# Patient Record
Sex: Female | Born: 1970 | Race: Black or African American | Hispanic: No | Marital: Married | Smoking: Never smoker
Health system: Southern US, Community
[De-identification: ages and names within clinical notes are randomized; demographics above are authoritative.]

## PROBLEM LIST (undated history)

## (undated) HISTORY — PX: ABDOMINAL HYSTERECTOMY: SHX81

---

## 1998-04-23 ENCOUNTER — Emergency Department (HOSPITAL_COMMUNITY): Admission: EM | Admit: 1998-04-23 | Discharge: 1998-04-23 | Payer: Self-pay | Admitting: Emergency Medicine

## 1998-09-27 ENCOUNTER — Inpatient Hospital Stay (HOSPITAL_COMMUNITY): Admission: AD | Admit: 1998-09-27 | Discharge: 1998-09-27 | Payer: Self-pay | Admitting: *Deleted

## 1998-10-06 ENCOUNTER — Ambulatory Visit (HOSPITAL_COMMUNITY): Admission: RE | Admit: 1998-10-06 | Discharge: 1998-10-06 | Payer: Self-pay | Admitting: Obstetrics and Gynecology

## 1998-11-12 ENCOUNTER — Inpatient Hospital Stay (HOSPITAL_COMMUNITY)
Admission: AD | Admit: 1998-11-12 | Discharge: 1998-11-16 | Payer: Self-pay | Admitting: Physical Medicine & Rehabilitation

## 1998-11-16 ENCOUNTER — Encounter (HOSPITAL_COMMUNITY): Admission: RE | Admit: 1998-11-16 | Discharge: 1999-02-14 | Payer: Self-pay | Admitting: *Deleted

## 1998-12-16 ENCOUNTER — Other Ambulatory Visit: Admission: RE | Admit: 1998-12-16 | Discharge: 1998-12-16 | Payer: Self-pay | Admitting: *Deleted

## 1999-11-01 ENCOUNTER — Inpatient Hospital Stay (HOSPITAL_COMMUNITY): Admission: AD | Admit: 1999-11-01 | Discharge: 1999-11-01 | Payer: Self-pay | Admitting: *Deleted

## 2000-01-01 ENCOUNTER — Inpatient Hospital Stay (HOSPITAL_COMMUNITY): Admission: RE | Admit: 2000-01-01 | Discharge: 2000-01-04 | Payer: Self-pay | Admitting: Obstetrics & Gynecology

## 2001-05-27 ENCOUNTER — Emergency Department (HOSPITAL_COMMUNITY): Admission: EM | Admit: 2001-05-27 | Discharge: 2001-05-28 | Payer: Self-pay | Admitting: *Deleted

## 2001-05-28 ENCOUNTER — Encounter: Payer: Self-pay | Admitting: Emergency Medicine

## 2001-06-10 ENCOUNTER — Other Ambulatory Visit: Admission: RE | Admit: 2001-06-10 | Discharge: 2001-06-10 | Payer: Self-pay | Admitting: *Deleted

## 2002-07-22 ENCOUNTER — Other Ambulatory Visit: Admission: RE | Admit: 2002-07-22 | Discharge: 2002-07-22 | Payer: Self-pay | Admitting: *Deleted

## 2004-04-12 ENCOUNTER — Other Ambulatory Visit: Admission: RE | Admit: 2004-04-12 | Discharge: 2004-04-12 | Payer: Self-pay | Admitting: Obstetrics and Gynecology

## 2004-06-21 ENCOUNTER — Ambulatory Visit (HOSPITAL_COMMUNITY): Admission: RE | Admit: 2004-06-21 | Discharge: 2004-06-21 | Payer: Self-pay | Admitting: Obstetrics and Gynecology

## 2004-06-21 ENCOUNTER — Ambulatory Visit (HOSPITAL_BASED_OUTPATIENT_CLINIC_OR_DEPARTMENT_OTHER): Admission: RE | Admit: 2004-06-21 | Discharge: 2004-06-21 | Payer: Self-pay | Admitting: Obstetrics and Gynecology

## 2010-08-15 ENCOUNTER — Ambulatory Visit: Payer: Self-pay | Admitting: Obstetrics and Gynecology

## 2012-06-19 ENCOUNTER — Emergency Department: Payer: Self-pay | Admitting: Internal Medicine

## 2012-06-19 LAB — CBC
HCT: 39.1 % (ref 35.0–47.0)
HGB: 13.4 g/dL (ref 12.0–16.0)
MCH: 31.3 pg (ref 26.0–34.0)
MCHC: 34.2 g/dL (ref 32.0–36.0)
MCV: 91 fL (ref 80–100)
Platelet: 283 10*3/uL (ref 150–440)
RBC: 4.28 10*6/uL (ref 3.80–5.20)
RDW: 13.4 % (ref 11.5–14.5)
WBC: 5.1 10*3/uL (ref 3.6–11.0)

## 2012-06-19 LAB — COMPREHENSIVE METABOLIC PANEL
Albumin: 4 g/dL (ref 3.4–5.0)
Alkaline Phosphatase: 63 U/L (ref 50–136)
Anion Gap: 8 (ref 7–16)
BUN: 10 mg/dL (ref 7–18)
Bilirubin,Total: 0.4 mg/dL (ref 0.2–1.0)
Calcium, Total: 9.2 mg/dL (ref 8.5–10.1)
Chloride: 103 mmol/L (ref 98–107)
Co2: 28 mmol/L (ref 21–32)
Creatinine: 0.9 mg/dL (ref 0.60–1.30)
EGFR (African American): >60
EGFR (Non-African Amer.): >60
Glucose: 102 mg/dL — ABNORMAL HIGH (ref 65–99)
Osmolality: 277 (ref 275–301)
Potassium: 3.8 mmol/L (ref 3.5–5.1)
SGOT(AST): 15 U/L (ref 15–37)
SGPT (ALT): 35 U/L (ref 12–78)
Sodium: 139 mmol/L (ref 136–145)
Total Protein: 7.9 g/dL (ref 6.4–8.2)

## 2012-06-19 LAB — URINALYSIS, COMPLETE
Bilirubin,UR: NEGATIVE
Blood: NEGATIVE
Glucose,UR: NEGATIVE mg/dL (ref 0–75)
Ketone: NEGATIVE
Nitrite: NEGATIVE
Ph: 8 (ref 4.5–8.0)
Protein: NEGATIVE
RBC,UR: <1 /HPF (ref 0–5)
Renal Epithelial: <1
Specific Gravity: 1.003 (ref 1.003–1.030)
Squamous Epithelial: 3
WBC UR: NONE SEEN /HPF (ref 0–5)

## 2012-06-19 LAB — LIPASE, BLOOD: Lipase: 115 U/L (ref 73–393)

## 2013-09-11 ENCOUNTER — Emergency Department: Payer: Self-pay | Admitting: Emergency Medicine

## 2013-09-11 LAB — BASIC METABOLIC PANEL
Anion Gap: 5 — ABNORMAL LOW (ref 7–16)
BUN: 9 mg/dL (ref 7–18)
Calcium, Total: 8.6 mg/dL (ref 8.5–10.1)
Chloride: 106 mmol/L (ref 98–107)
Co2: 27 mmol/L (ref 21–32)
Creatinine: 0.96 mg/dL (ref 0.60–1.30)
EGFR (African American): >60
EGFR (Non-African Amer.): >60
Glucose: 88 mg/dL (ref 65–99)
Osmolality: 274 (ref 275–301)
Potassium: 3.8 mmol/L (ref 3.5–5.1)
Sodium: 138 mmol/L (ref 136–145)

## 2013-09-11 LAB — TROPONIN I
Troponin-I: <0.02 ng/mL
Troponin-I: <0.02 ng/mL

## 2013-09-11 LAB — CBC
HCT: 35.6 % (ref 35.0–47.0)
HGB: 12 g/dL (ref 12.0–16.0)
MCH: 31.4 pg (ref 26.0–34.0)
MCHC: 33.8 g/dL (ref 32.0–36.0)
MCV: 93 fL (ref 80–100)
Platelet: 169 10*3/uL (ref 150–440)
RBC: 3.83 10*6/uL (ref 3.80–5.20)
RDW: 13.6 % (ref 11.5–14.5)
WBC: 6 10*3/uL (ref 3.6–11.0)

## 2013-10-25 IMAGING — CR DG ABDOMEN 3V
1 series · 4 of 4 positions shown · non-contrast
Comparison: none

REASON FOR EXAM: abd pain
COMMENTS:

[Series 1: pa · 0.17mm/px · 4 of 4 slices shown]
[im 1/4]
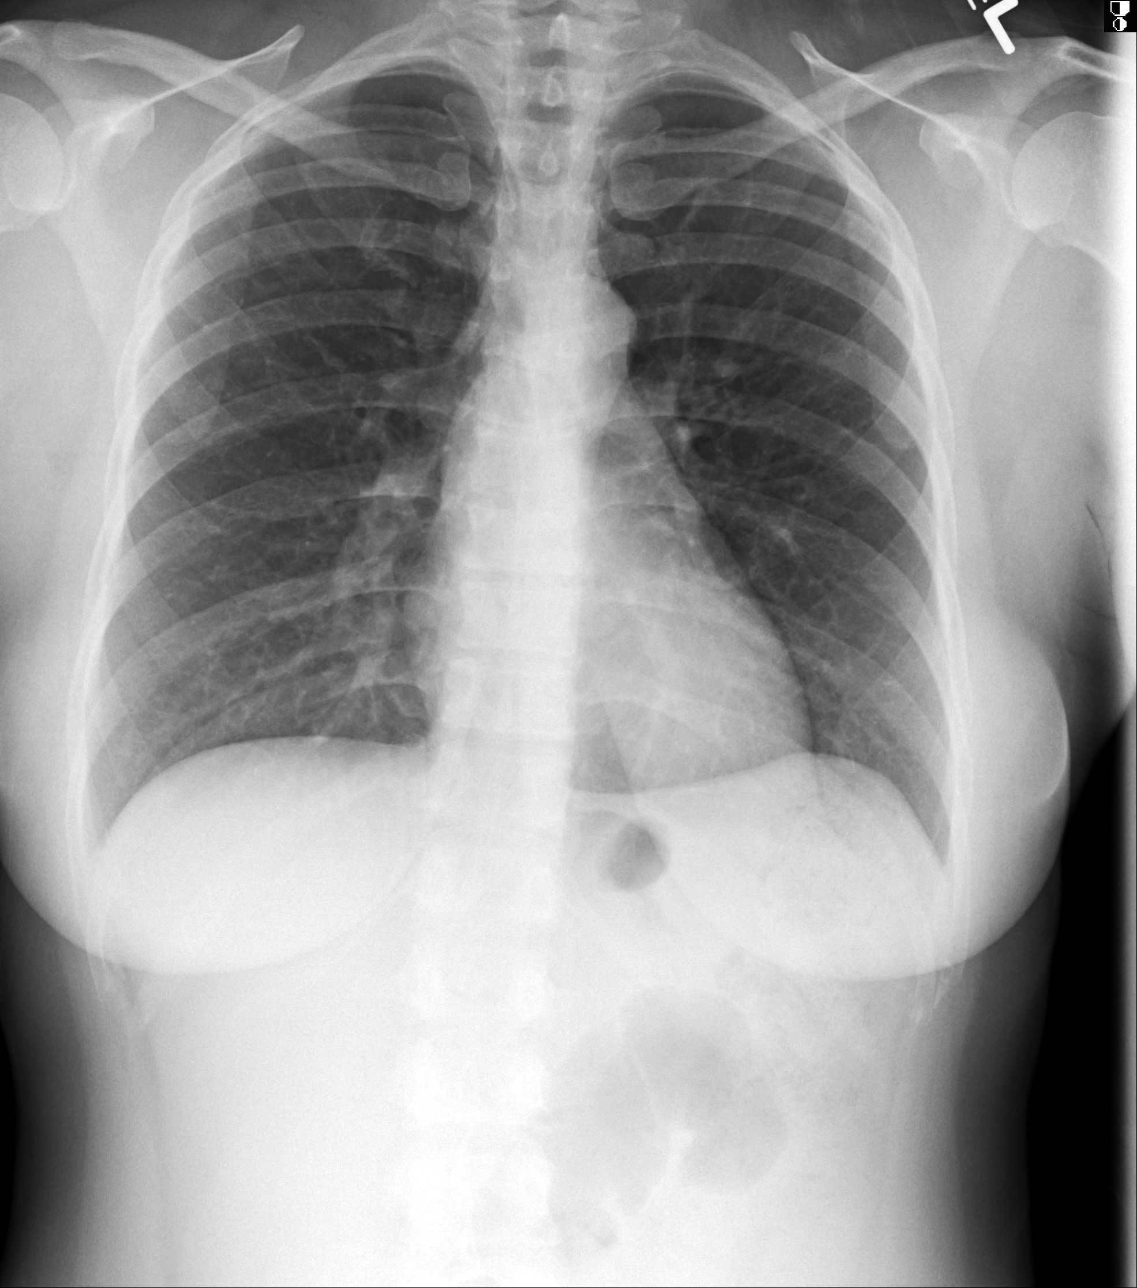
[im 2/4]
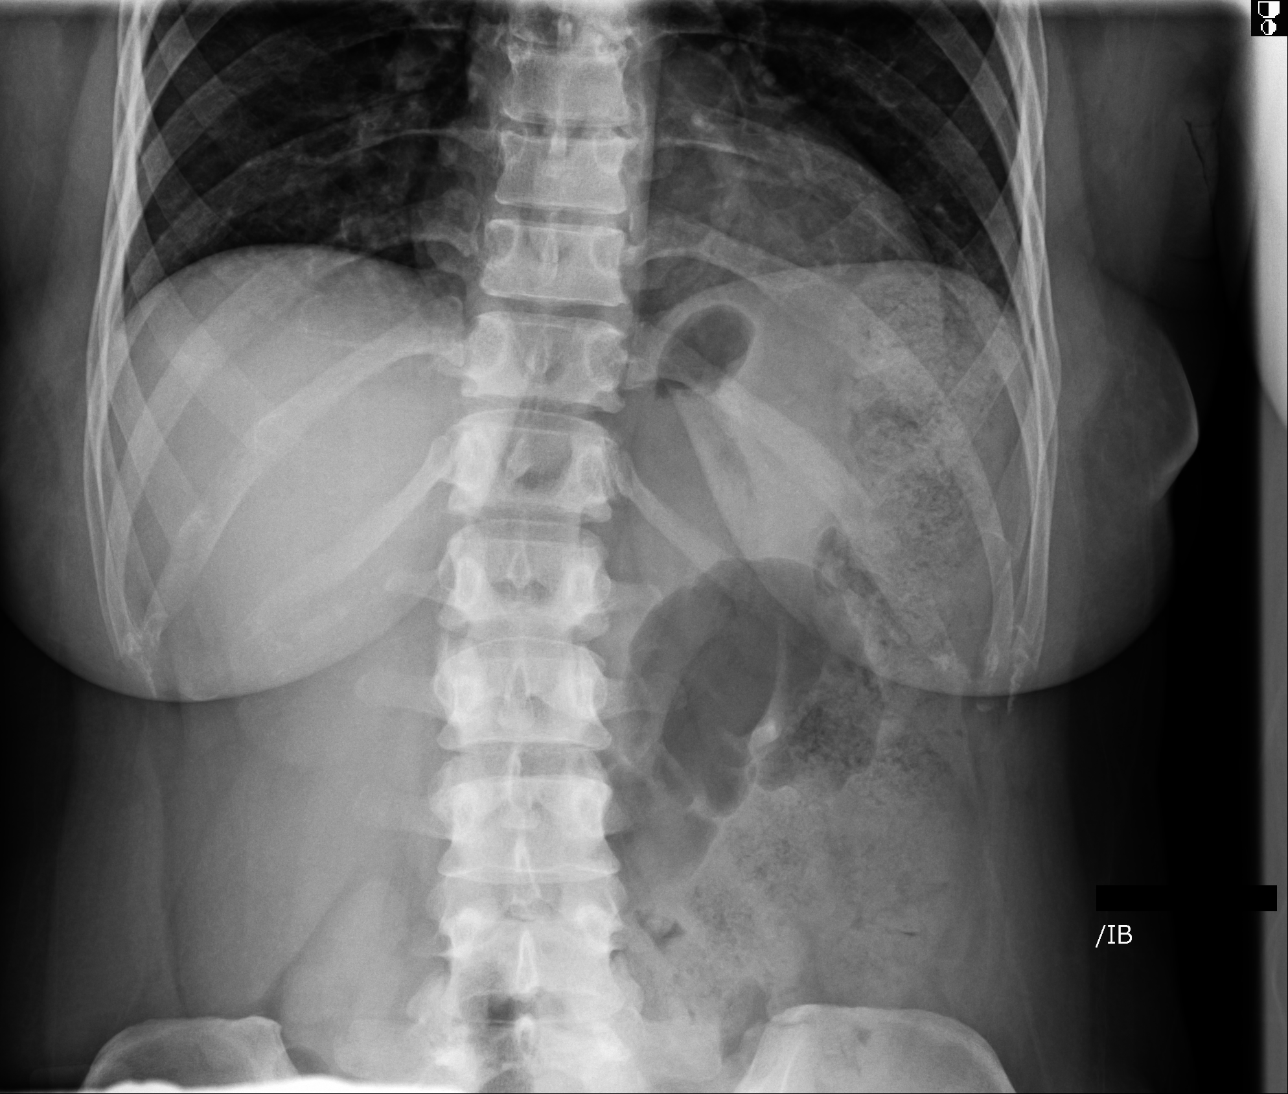
[im 3/4]
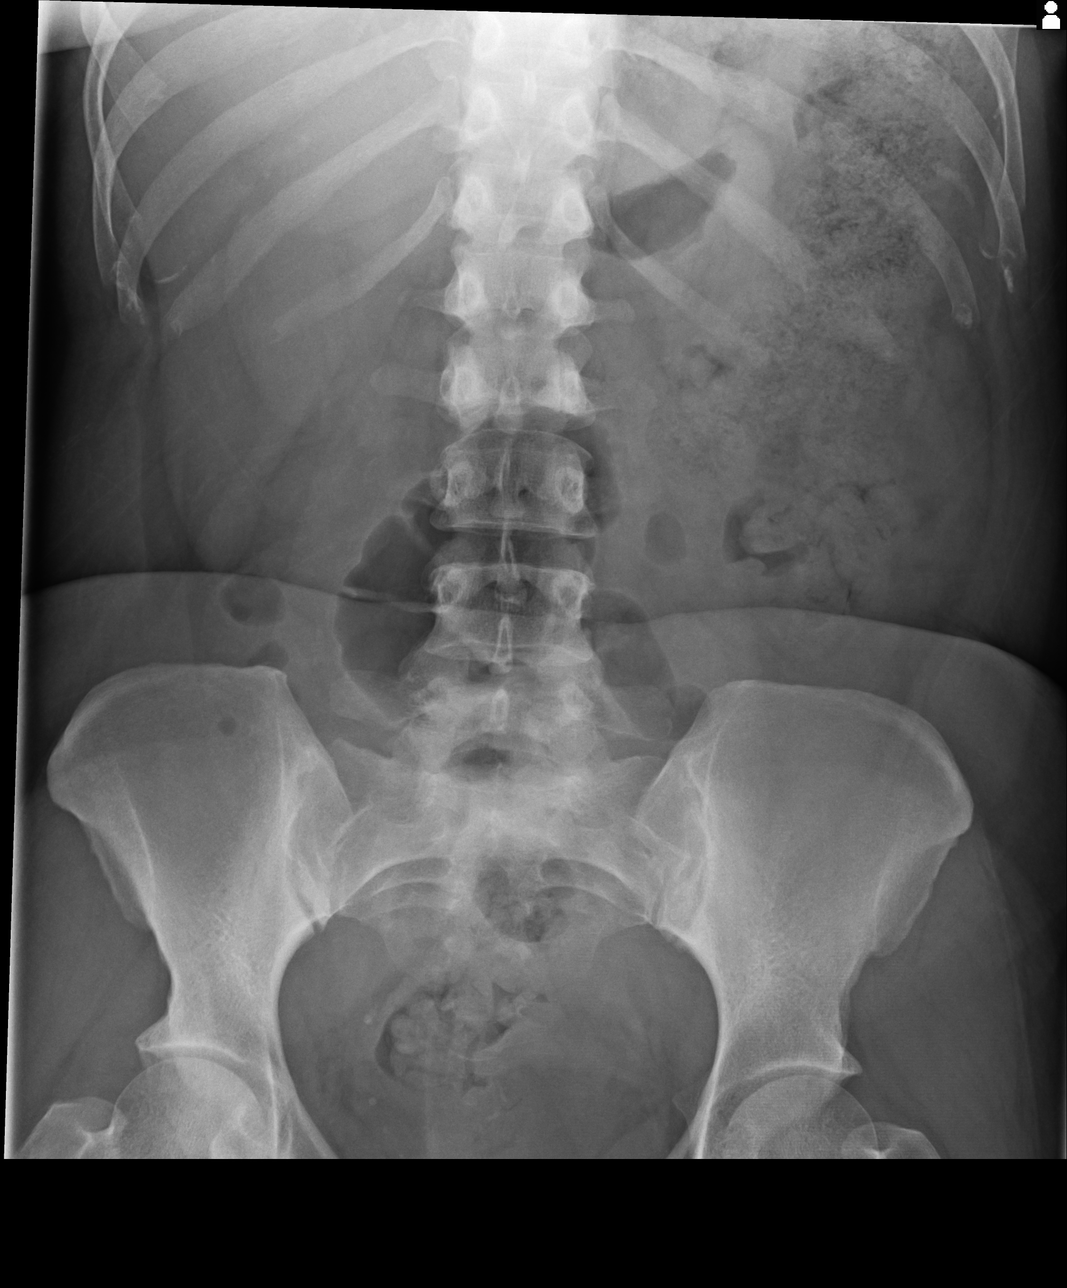
[im 4/4]
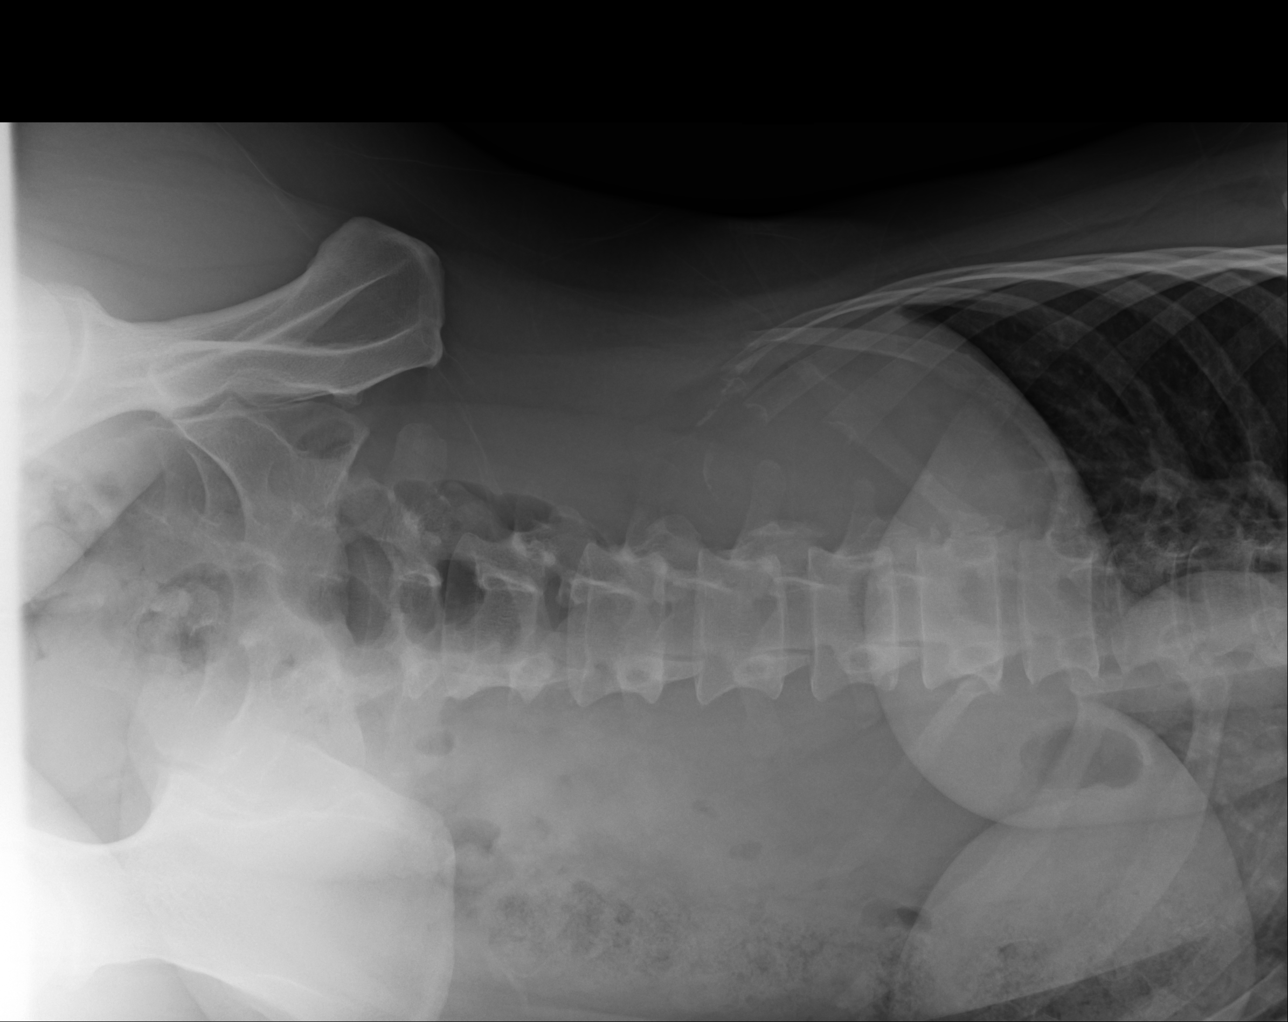

[4 of 4 positions shown; findings below may reference images not displayed]

PROCEDURE:     DXR - DXR ABDOMEN 3-WAY (INCL PA CXR)  - June 19, 2012 [DATE]

RESULT:     There is no previous exam for comparison.

The lungs are clear. The heart and pulmonary vessels are normal. There is no
intraperitoneal free air. There is a moderate amount of fecal material in
the colon especially in the left abdomen. There is no abnormal bowel
distention or air-fluid levels within loops of bowel. The bony structures
appear unremarkable.
IMPRESSION: 1. No acute cardiopulmonary disease.
2. No evidence of bowel obstruction or perforation. Moderate amount of fecal
material present.

[REDACTED]

## 2014-10-28 DIAGNOSIS — E538 Deficiency of other specified B group vitamins: Secondary | ICD-10-CM | POA: Insufficient documentation

## 2014-10-29 DIAGNOSIS — G4700 Insomnia, unspecified: Secondary | ICD-10-CM | POA: Insufficient documentation

## 2014-10-29 DIAGNOSIS — K5229 Other allergic and dietetic gastroenteritis and colitis: Secondary | ICD-10-CM | POA: Insufficient documentation

## 2015-01-17 IMAGING — CR DG CHEST 2V
1 series · 2 of 2 positions shown · non-contrast
Comparison: None.

CLINICAL DATA: Chest pain and shortness of breath.

EXAM:
CHEST  2 VIEW

[Series 1: w chest pa · 0.14mm/px · 2 of 2 slices shown]
[im 1/2]
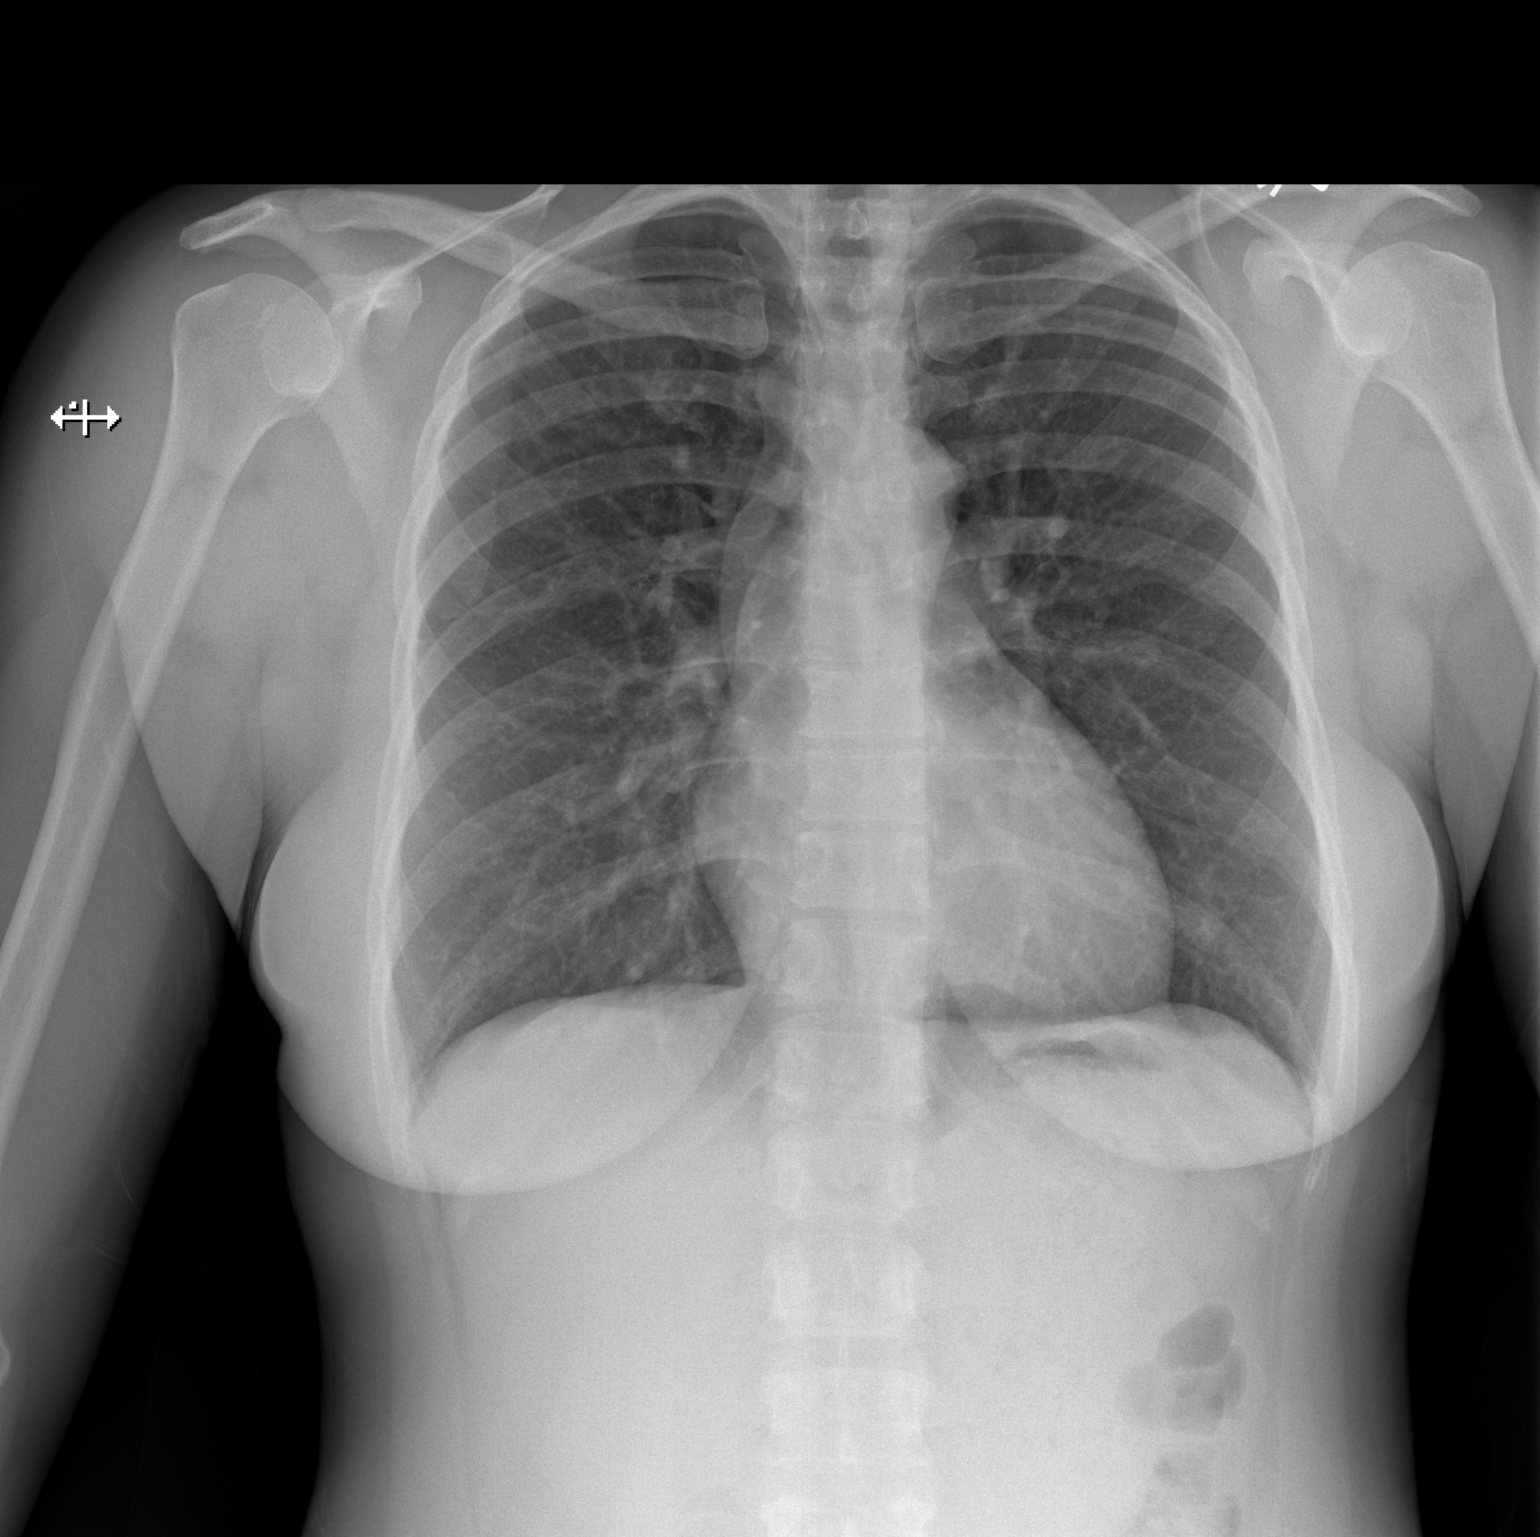
[im 2/2]
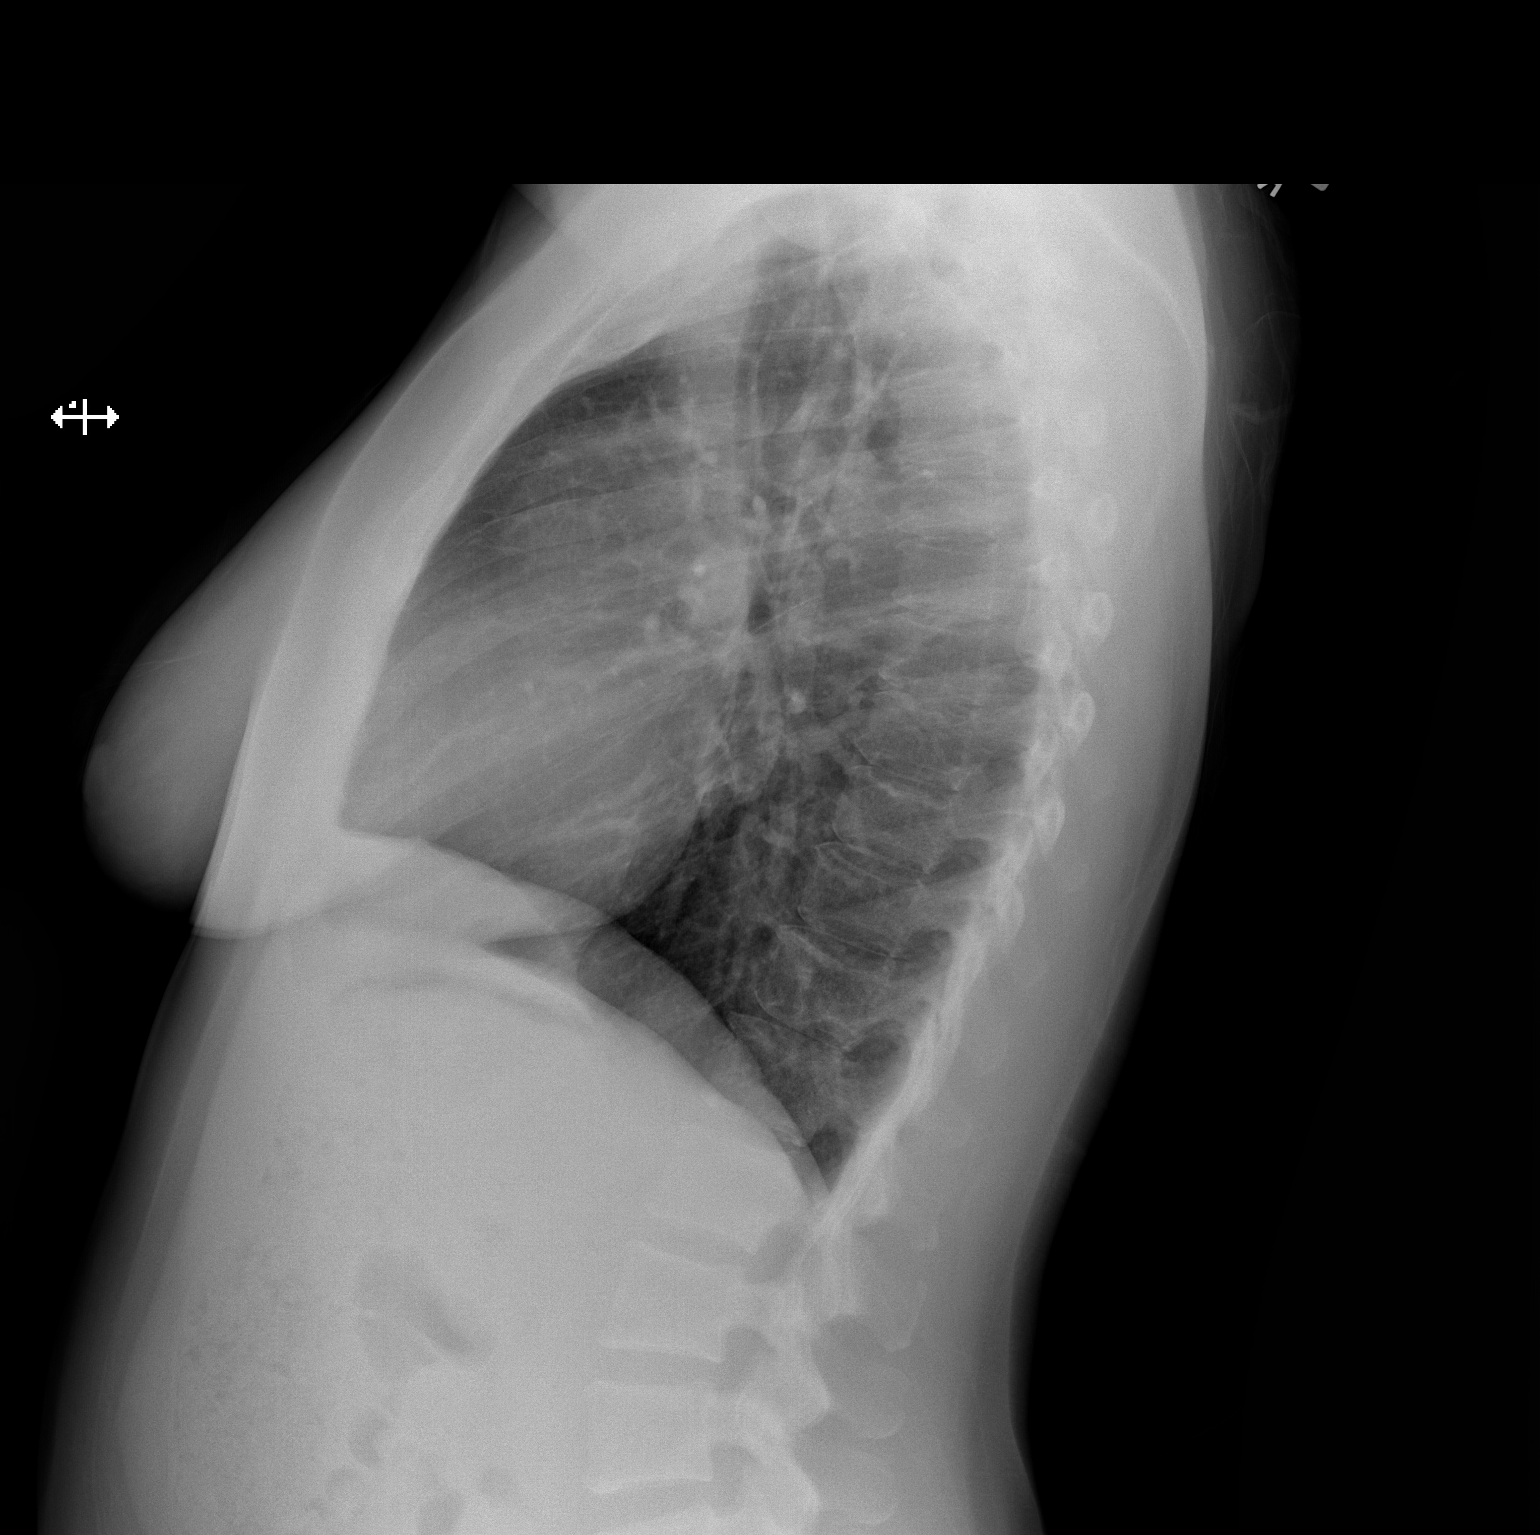

[2 of 2 positions shown; findings below may reference images not displayed]

FINDINGS: The heart size and mediastinal contours are within normal limits.
Both lungs are clear. The visualized skeletal structures are
unremarkable.
IMPRESSION: No active cardiopulmonary disease.

## 2015-02-09 DIAGNOSIS — R002 Palpitations: Secondary | ICD-10-CM | POA: Insufficient documentation

## 2015-04-22 DIAGNOSIS — E559 Vitamin D deficiency, unspecified: Secondary | ICD-10-CM | POA: Insufficient documentation

## 2015-05-27 DIAGNOSIS — L301 Dyshidrosis [pompholyx]: Secondary | ICD-10-CM | POA: Insufficient documentation

## 2020-01-01 ENCOUNTER — Telehealth (INDEPENDENT_AMBULATORY_CARE_PROVIDER_SITE_OTHER): Payer: Self-pay | Admitting: Gastroenterology

## 2020-01-01 ENCOUNTER — Other Ambulatory Visit: Payer: Self-pay

## 2020-01-01 DIAGNOSIS — R011 Cardiac murmur, unspecified: Secondary | ICD-10-CM | POA: Insufficient documentation

## 2020-01-01 DIAGNOSIS — Z1211 Encounter for screening for malignant neoplasm of colon: Secondary | ICD-10-CM

## 2020-01-01 NOTE — Progress Notes (Signed)
Gastroenterology Pre-Procedure Review  Request Date: May 17 th Requesting Physician: Dr. Allegra Lai  PATIENT REVIEW QUESTIONS: The patient responded to the following health history questions as indicated:    1. Are you having any GI issues? no 2. Do you have a personal history of Polyps? no 3. Do you have a family history of Colon Cancer or Polyps? no 4. Diabetes Mellitus? no 5. Joint replacements in the past 12 months?no 6. Major health problems in the past 3 months?no 7. Any artificial heart valves, MVP, or defibrillator?no    MEDICATIONS & ALLERGIES:    Patient reports the following regarding taking any anticoagulation/antiplatelet therapy:   Plavix, Coumadin, Eliquis, Xarelto, Lovenox, Pradaxa, Brilinta, or Effient? no Aspirin? no  Patient confirms/reports the following medications:  Current Outpatient Medications  Medication Sig Dispense Refill  . clobetasol cream (TEMOVATE) 0.05 % Apply topically.    . ergocalciferol (VITAMIN D2) 1.25 MG (50000 UT) capsule Take as directed    . Multiple Vitamin (MULTIVITAMIN ADULT PO) Take by mouth.     No current facility-administered medications for this visit.    Patient confirms/reports the following allergies:  Allergies  Allergen Reactions  . Iodine Shortness Of Breath  . Sulfa Antibiotics Anxiety, Nausea Only and Shortness Of Breath    No orders of the defined types were placed in this encounter.   AUTHORIZATION INFORMATION Primary Insurance: 1D#: Group #:  Secondary Insurance: 1D#: Group #:  SCHEDULE INFORMATION: Date: Monday May 17th Time: Location:[facility]

## 2020-01-21 ENCOUNTER — Other Ambulatory Visit
Admission: RE | Admit: 2020-01-21 | Discharge: 2020-01-21 | Disposition: A | Discharge status: Home / Self Care | Payer: BC Managed Care – PPO | Source: Ambulatory Visit | Attending: Gastroenterology | Admitting: Gastroenterology

## 2020-01-21 ENCOUNTER — Other Ambulatory Visit: Payer: Self-pay

## 2020-01-21 DIAGNOSIS — Z20822 Contact with and (suspected) exposure to covid-19: Secondary | ICD-10-CM | POA: Insufficient documentation

## 2020-01-21 DIAGNOSIS — Z01812 Encounter for preprocedural laboratory examination: Secondary | ICD-10-CM | POA: Insufficient documentation

## 2020-01-21 LAB — SARS CORONAVIRUS 2 (TAT 6-24 HRS): SARS Coronavirus 2: NEGATIVE

## 2020-01-22 ENCOUNTER — Encounter: Payer: Self-pay | Admitting: Gastroenterology

## 2020-01-25 ENCOUNTER — Encounter
Admission: RE | Disposition: A | Discharge status: Home / Self Care | Payer: Self-pay | Source: Home / Self Care | Attending: Gastroenterology

## 2020-01-25 ENCOUNTER — Ambulatory Visit
Admission: RE | Admit: 2020-01-25 | Discharge: 2020-01-25 | Disposition: A | Discharge status: Home / Self Care | Payer: BC Managed Care – PPO | Attending: Gastroenterology | Admitting: Gastroenterology

## 2020-01-25 ENCOUNTER — Ambulatory Visit: Payer: BC Managed Care – PPO | Admitting: Anesthesiology

## 2020-01-25 DIAGNOSIS — Z1211 Encounter for screening for malignant neoplasm of colon: Secondary | ICD-10-CM

## 2020-01-25 HISTORY — PX: COLONOSCOPY WITH PROPOFOL: SHX5780

## 2020-01-25 SURGERY — COLONOSCOPY WITH PROPOFOL
Anesthesia: General

## 2020-01-25 MED ORDER — PROPOFOL 10 MG/ML IV BOLUS
INTRAVENOUS | Status: DC | PRN
  Administered 2020-01-25: 100 mg via INTRAVENOUS

## 2020-01-25 MED ORDER — SODIUM CHLORIDE 0.9 % IV SOLN
INTRAVENOUS | Status: DC
  Administered 2020-01-25: 1000 mL via INTRAVENOUS

## 2020-01-25 MED ORDER — PROPOFOL 500 MG/50ML IV EMUL
INTRAVENOUS | Status: DC | PRN
  Administered 2020-01-25: 120 ug/kg/min via INTRAVENOUS

## 2020-01-25 MED ORDER — LIDOCAINE HCL (PF) 2 % IJ SOLN
INTRAMUSCULAR | Status: AC
  Filled 2020-01-25: qty 5

## 2020-01-25 MED ORDER — PROPOFOL 500 MG/50ML IV EMUL
INTRAVENOUS | Status: AC
  Filled 2020-01-25: qty 50

## 2020-01-25 NOTE — Transfer of Care (Signed)
Immediate Anesthesia Transfer of Care Note  Patient: Angelica Moore  Procedure(s) Performed: COLONOSCOPY WITH PROPOFOL (N/A )  Patient Location: Endoscopy Unit  Anesthesia Type:General  Level of Consciousness: drowsy and patient cooperative  Airway & Oxygen Therapy: Patient Spontanous Breathing  Post-op Assessment: Report given to RN and Post -op Vital signs reviewed and stable  Post vital signs: Reviewed and stable  Last Vitals:  Vitals Value Taken Time  BP 125/73 01/25/20 0957  Temp 36.6 C 01/25/20 0955  Pulse 68 01/25/20 0957  Resp 22 01/25/20 0957  SpO2 97 % 01/25/20 0957  Vitals shown include unvalidated device data.  Last Pain:  Vitals:   01/25/20 0955  TempSrc: Temporal  PainSc: 0-No pain         Complications: No apparent anesthesia complications

## 2020-01-25 NOTE — Anesthesia Postprocedure Evaluation (Signed)
Anesthesia Post Note  Patient: Angelica Moore  Procedure(s) Performed: COLONOSCOPY WITH PROPOFOL (N/A )  Patient location during evaluation: Endoscopy Anesthesia Type: General Level of consciousness: awake and alert Pain management: pain level controlled Vital Signs Assessment: post-procedure vital signs reviewed and stable Respiratory status: spontaneous breathing, nonlabored ventilation, respiratory function stable and patient connected to nasal cannula oxygen Cardiovascular status: blood pressure returned to baseline and stable Postop Assessment: no apparent nausea or vomiting Anesthetic complications: no     Last Vitals:  Vitals:   01/25/20 0847 01/25/20 0955  BP: (!) 145/95 125/73  Pulse: 65 72  Resp: 20 (!) 24  Temp: (!) 36.2 C 36.6 C  SpO2: 100% 99%    Last Pain:  Vitals:   01/25/20 1015  TempSrc:   PainSc: 0-No pain                 Corinda Gubler

## 2020-01-25 NOTE — Anesthesia Preprocedure Evaluation (Signed)
Anesthesia Evaluation  Patient identified by MRN, date of birth, ID band Patient awake    Reviewed: Allergy & Precautions, NPO status , Patient's Chart, lab work & pertinent test results  History of Anesthesia Complications Negative for: history of anesthetic complications  Airway Mallampati: II  TM Distance: >3 FB Neck ROM: Full    Dental no notable dental hx. (+) Teeth Intact   Pulmonary neg pulmonary ROS, neg sleep apnea, neg COPD, Patient abstained from smoking.Not current smoker,    Pulmonary exam normal breath sounds clear to auscultation       Cardiovascular Exercise Tolerance: Good METS(-) hypertension(-) CAD and (-) Past MI negative cardio ROS  (-) dysrhythmias + Valvular Problems/Murmurs  Rhythm:Regular Rate:Normal - Systolic murmurs Benign heart murmur hx   Neuro/Psych negative neurological ROS  negative psych ROS   GI/Hepatic neg GERD  ,(+)     (-) substance abuse  ,   Endo/Other  neg diabetes  Renal/GU negative Renal ROS     Musculoskeletal   Abdominal   Peds  Hematology   Anesthesia Other Findings History reviewed. No pertinent past medical history.  Reproductive/Obstetrics                             Anesthesia Physical Anesthesia Plan  ASA: II  Anesthesia Plan: General   Post-op Pain Management:    Induction: Intravenous  PONV Risk Score and Plan: 3 and Ondansetron, Propofol infusion and TIVA  Airway Management Planned: Nasal Cannula  Additional Equipment: None  Intra-op Plan:   Post-operative Plan:   Informed Consent: I have reviewed the patients History and Physical, chart, labs and discussed the procedure including the risks, benefits and alternatives for the proposed anesthesia with the patient or authorized representative who has indicated his/her understanding and acceptance.     Dental advisory given  Plan Discussed with: CRNA and  Surgeon  Anesthesia Plan Comments: (Discussed risks of anesthesia with patient, including possibility of difficulty with spontaneous ventilation under anesthesia necessitating airway intervention, PONV, and rare risks such as cardiac or respiratory or neurological events. Patient understands.)        Anesthesia Quick Evaluation

## 2020-01-25 NOTE — H&P (Signed)
Angelica Darby, MD [address]  [place]  [place], [place] [address]  Main: [phone]  Fax: [phone] Pager: [phone]  Primary Care Physician:  Sharyne Peach, MD Primary Gastroenterologist:  Dr. Cephas Moore  Pre-Procedure History & Physical: HPI:  Angelica Moore is a 49 y.o. female is here for an colonoscopy.   History reviewed. No pertinent past medical history.  History reviewed. No pertinent surgical history.  Prior to Admission medications   Medication Sig Start Date End Date Taking? Authorizing Provider  clobetasol cream (TEMOVATE) 0.05 % Apply topically. 11/03/19 11/02/20 Yes [provider]  ergocalciferol (VITAMIN D2) 1.25 MG (50000 UT) capsule Take as directed 07/15/19  Yes [provider]  Multiple Vitamin (MULTIVITAMIN ADULT PO) Take by mouth.   Yes [provider]    Allergies as of 01/01/2020 - Review Complete 01/01/2020  Allergen Reaction Noted  . Iodine Shortness Of Breath 03/27/2012  . Sulfa antibiotics Anxiety, Nausea Only, and Shortness Of Breath 03/27/2012    History reviewed. No pertinent family history.  Social History   Socioeconomic History  . Marital status: Married    Spouse name: Not on file  . Number of children: Not on file  . Years of education: Not on file  . Highest education level: Not on file  Occupational History  . Not on file  Tobacco Use  . Smoking status: Not on file  Substance and Sexual Activity  . Alcohol use: Not on file  . Drug use: Not on file  . Sexual activity: Not on file  Other Topics Concern  . Not on file  Social History Narrative  . Not on file   Social Determinants of Health   Financial Resource Strain:   . Difficulty of Paying Living Expenses:   Food Insecurity:   . Worried About Charity fundraiser in the Last Year:   . Arboriculturist in the Last Year:   Transportation Needs:   . Film/video editor (Medical):   Marland Kitchen Lack of Transportation  (Non-Medical):   Physical Activity:   . Days of Exercise per Week:   . Minutes of Exercise per Session:   Stress:   . Feeling of Stress :   Social Connections:   . Frequency of Communication with Friends and Family:   . Frequency of Social Gatherings with Friends and Family:   . Attends Religious Services:   . Active Member of Clubs or Organizations:   . Attends Archivist Meetings:   Marland Kitchen Marital Status:   Intimate Partner Violence:   . Fear of Current or Ex-Partner:   . Emotionally Abused:   Marland Kitchen Physically Abused:   . Sexually Abused:     Review of Systems: See HPI, otherwise negative ROS  Physical Exam: BP (!) 145/95   Pulse 65   Temp (!) 97.2 F (36.2 C) (Temporal)   Resp 20   Ht 5' (1.524 m)   Wt 81.6 kg   SpO2 100%   BMI 35.15 kg/m  General:   Alert,  pleasant and cooperative in NAD Head:  Normocephalic and atraumatic. Neck:  Supple; no masses or thyromegaly. Lungs:  Clear throughout to auscultation.    Heart:  Regular rate and rhythm. Abdomen:  Soft, nontender and nondistended. Normal bowel sounds, without guarding, and without rebound.   Neurologic:  Alert and  oriented x4;  grossly normal neurologically.  Impression/Plan: Angelica Moore is here for an colonoscopy to be performed for colon cancer  screening  Risks, benefits, limitations, and alternatives regarding  colonoscopy have been reviewed with the patient.  Questions have been answered.  All parties agreeable.   Lannette Donath, MD  01/25/2020, 9:29 AM

## 2020-01-25 NOTE — Op Note (Signed)
[facility] Gastroenterology Patient Name: Angelica Moore Procedure Date: 01/25/2020 9:26 AM MRN: [id number] Account #: [id number] Date of Birth: 10-07-1970 Admit Type: Outpatient Age: 49 Room: [facility] ENDO ROOM 1 Gender: Female Note Status: Finalized Procedure:             Colonoscopy Indications:           Screening for colorectal malignant neoplasm, This is                         the patient's first colonoscopy Providers:             Lin Landsman MD, MD Referring MD:          Rubbie Battiest. Iona Beard MD, MD (Referring MD) Medicines:             Monitored Anesthesia Care Complications:         No immediate complications. Estimated blood loss: None. Procedure:             Pre-Anesthesia Assessment:                        - Prior to the procedure, a History and Physical was                         performed, and patient medications and allergies were                         reviewed. The patient is competent. The risks and                         benefits of the procedure and the sedation options and                         risks were discussed with the patient. All questions                         were answered and informed consent was obtained.                         Patient identification and proposed procedure were                         verified by the physician, the nurse, the                         anesthesiologist, the anesthetist and the technician                         in the pre-procedure area in the procedure room in the                         endoscopy suite. Mental Status Examination: alert and                         oriented. Airway Examination: normal oropharyngeal                         airway and neck mobility. Respiratory Examination:  clear to auscultation. CV Examination: normal.                         Prophylactic Antibiotics: The patient does not require                         prophylactic antibiotics. Prior  Anticoagulants: The                         patient has taken no previous anticoagulant or                         antiplatelet agents. ASA Grade Assessment: II - A                         patient with mild systemic disease. After reviewing                         the risks and benefits, the patient was deemed in                         satisfactory condition to undergo the procedure. The                         anesthesia plan was to use monitored anesthesia care                         (MAC). Immediately prior to administration of                         medications, the patient was re-assessed for adequacy                         to receive sedatives. The heart rate, respiratory                         rate, oxygen saturations, blood pressure, adequacy of                         pulmonary ventilation, and response to care were                         monitored throughout the procedure. The physical                         status of the patient was re-assessed after the                         procedure.                        After obtaining informed consent, the colonoscope was                         passed under direct vision. Throughout the procedure,                         the patient's blood pressure, pulse, and oxygen  saturations were monitored continuously. The                         Colonoscope was introduced through the anus and                         advanced to the the cecum, identified by appendiceal                         orifice and ileocecal valve. The colonoscopy was                         performed without difficulty. The patient tolerated                         the procedure well. The quality of the bowel                         preparation was evaluated using the BBPS [facility] Bowel                         Preparation Scale) with scores of: Right Colon = 3,                         Transverse Colon = 3 and Left Colon = 3 (entire mucosa                          seen well with no residual staining, small fragments                         of stool or opaque liquid). The total BBPS score                         equals 9. Findings:      The perianal and digital rectal examinations were normal. Pertinent       negatives include normal sphincter tone and no palpable rectal lesions.      The entire examined colon appeared normal.      The retroflexed view of the distal rectum and anal verge was normal and       showed no anal or rectal abnormalities. Impression:            - The entire examined colon is normal.                        - The distal rectum and anal verge are normal on                         retroflexion view.                        - No specimens collected. Recommendation:        - Discharge patient to home (with escort).                        - Resume previous diet today.                        - Continue present medications.                        -  Repeat colonoscopy in 10 years for surveillance. Procedure Code(s):     --- Professional ---                        U8372, Colorectal cancer screening; colonoscopy on                         individual not meeting criteria for high risk Diagnosis Code(s):     --- Professional ---                        Z12.11, Encounter for screening for malignant neoplasm                         of colon CPT copyright 2019 American Medical Association. All rights reserved. The codes documented in this report are preliminary and upon coder review may  be revised to meet current compliance requirements. Dr. Ulyess Mort Lin Landsman MD, MD 01/25/2020 9:52:34 AM This report has been signed electronically. Number of Addenda: 0 Note Initiated On: 01/25/2020 9:26 AM Scope Withdrawal Time: 0 hours 9 minutes 26 seconds  Total Procedure Duration: 0 hours 13 minutes 46 seconds  Estimated Blood Loss:  Estimated blood loss: none.      [facility]

## 2020-01-27 ENCOUNTER — Encounter: Payer: Self-pay | Admitting: *Deleted

## 2022-03-26 ENCOUNTER — Encounter: Payer: Self-pay | Admitting: Emergency Medicine

## 2022-03-26 ENCOUNTER — Ambulatory Visit
Admission: EM | Admit: 2022-03-26 | Discharge: 2022-03-26 | Disposition: A | Discharge status: Home / Self Care | Payer: BC Managed Care – PPO | Attending: Emergency Medicine | Admitting: Emergency Medicine

## 2022-03-26 DIAGNOSIS — R3915 Urgency of urination: Secondary | ICD-10-CM | POA: Diagnosis present

## 2022-03-26 LAB — POCT URINALYSIS DIP (MANUAL ENTRY)
Bilirubin, UA: NEGATIVE
Glucose, UA: NEGATIVE mg/dL
Ketones, POC UA: NEGATIVE mg/dL
Leukocytes, UA: NEGATIVE
Nitrite, UA: NEGATIVE
Protein Ur, POC: NEGATIVE mg/dL
Spec Grav, UA: 1.015 (ref 1.010–1.025)
Urobilinogen, UA: 0.2 E.U./dL
pH, UA: 5.5 (ref 5.0–8.0)

## 2022-03-26 MED ORDER — CEPHALEXIN 500 MG PO CAPS: 500.0000 mg | ORAL_CAPSULE | Freq: Four times a day (QID) | ORAL | 0 refills | Status: DC

## 2022-03-26 MED ORDER — PHENAZOPYRIDINE HCL 200 MG PO TABS
200.0000 mg | ORAL_TABLET | Freq: Three times a day (TID) | ORAL | 0 refills | Status: DC
Start: 2022-03-26 — End: 2023-12-19

## 2022-03-26 NOTE — Discharge Instructions (Addendum)
Your urinalysis was negative for infection, results may be skewed due to recent use of antibiotic, your urine has been sent to the lab to determine if bacteria will grow, if any changes need to take at that time  Begin use of Keflex every 6 hours for 5 days for additional bacterial coverage  You may use Pyridium every 8 hours for the next 2 days, this medicine helps to reduce urinary infection symptoms, it will turn your urine a bright neon orange, this will resolve after use  May use Tylenol 500 to 1000 mg every 6 hours or ibuprofen 800 mg every 8 hours for discomfort  Increase your fluid intake through use of water  As always practice good hygiene, wiping front to back and avoidance of scented vaginal products to prevent further irritation  If symptoms continue to persist after use of medication or recur please follow-up with urgent care or your primary doctor as needed

## 2022-03-26 NOTE — ED Triage Notes (Signed)
Pt presents with lower right side back pain, dysuria and fatigue x 5 days. She did a tele-visit 3 days ago and was prescribed Cipro 500 mg BID.

## 2022-03-26 NOTE — ED Provider Notes (Signed)
Renaldo Fiddler    CSN: [id number] Arrival date & time: 03/26/22  1426      History   Chief Complaint Chief Complaint  Patient presents with   Back Pain   Fatigue   Headache   Dysuria    HPI ANGELIN CUTRONE is a 51 y.o. female.   Patient presents with dysuria, urgency, right-sided flank pain, lower abdominal pressure for 5 days.  Symptoms are worsening.  Completed ED visit 3 days ago and completed course of ciprofloxacin.  Denies vaginal symptoms, hematuria, fever or chills.  History reviewed. No pertinent past medical history.  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      Home Medications    Prior to Admission medications   Medication Sig Start Date End Date Taking? Authorizing Provider  cephALEXin (KEFLEX) 500 MG capsule Take 1 capsule (500 mg total) by mouth 4 (four) times daily. 03/26/22  Yes Josyah Achor R, NP  phenazopyridine (PYRIDIUM) 200 MG tablet Take 1 tablet (200 mg total) by mouth 3 (three) times daily. 03/26/22  Yes Daquann Merriott, Elita Boone, NP  ergocalciferol (VITAMIN D2) 1.25 MG (50000 UT) capsule Take as directed 07/15/19   [provider]  Multiple Vitamin (MULTIVITAMIN ADULT PO) Take by mouth.    [provider]    Family History No family history  on file.  Social History Social History   Tobacco Use   Smoking status: Never   Smokeless tobacco: Never  Substance Use Topics   Alcohol use: Yes   Drug use: Never     Allergies   Iodine and Sulfa antibiotics   Review of Systems Review of Systems  Constitutional: Negative.   Respiratory: Negative.    Cardiovascular: Negative.   Genitourinary:  Positive for dysuria, flank pain, pelvic pain and urgency. Negative for decreased urine volume, difficulty urinating, dyspareunia, enuresis, frequency, genital sores, hematuria, menstrual problem, vaginal bleeding, vaginal discharge and vaginal pain.  Skin: Negative.   Neurological:  Positive for headaches.     Physical Exam Triage Vital Signs ED Triage Vitals  Enc Vitals Group     BP 03/26/22 1448 126/80     Pulse Rate 03/26/22 1448 61     Resp 03/26/22 1448 18     Temp 03/26/22 1448 98.7 F (37.1 C)     Temp Source 03/26/22 1448 Oral     SpO2 03/26/22 1448 95 %     Weight --      Height --      Head Circumference --      Peak Flow --      Pain Score 03/26/22 1447 5     Pain Loc --      Pain Edu? --      Excl. in GC? --    No data found.  Updated  Vital Signs BP 126/80 (BP Location: Left Arm)   Pulse 61   Temp 98.7 F (37.1 C) (Oral)   Resp 18   SpO2 95%   Visual Acuity Right Eye Distance:   Left Eye Distance:   Bilateral Distance:    Right Eye Near:   Left Eye Near:    Bilateral Near:     Physical Exam Constitutional:      Appearance: Normal appearance.  Eyes:     Extraocular Movements: Extraocular movements intact.  Pulmonary:     Effort: Pulmonary effort is normal.  Abdominal:     General: Abdomen is flat. Bowel sounds are normal.     Palpations: Abdomen is soft.     Tenderness: There is abdominal tenderness in the suprapubic area. There is right CVA tenderness.  Neurological:     General: No focal deficit present.     Mental Status: She is alert and oriented to person, place, and time.   Psychiatric:        Mood and Affect: Mood normal.        Behavior: Behavior normal.      UC Treatments / Results  Labs (all labs ordered are listed, but only abnormal results are displayed) Labs Reviewed  POCT URINALYSIS DIP (MANUAL ENTRY) - Abnormal; Notable for the following components:      Result Value   Blood, UA trace-lysed (*)    All other components within normal limits  URINE CULTURE  CERVICOVAGINAL ANCILLARY ONLY    EKG   Radiology No results found.  Procedures Procedures (including critical care time)  Medications Ordered in UC Medications - No data to display  Initial Impression / Assessment and Plan / UC Course  I have reviewed the triage vital signs and the nursing notes.  Pertinent labs & imaging results that were available during my care of the patient were reviewed by me and considered in my medical decision making (see chart for details).  Urinary urgency  Vital signs stable patient is in no signs of distress, urinalysis negative, possibly skewed by antibiotic use, sent for culture, discussed findings with patient, will swab to rule out BV and yeast pending, will treat per protocol, prescribed Keflex for additional bacterial coverage as well as Pyridium for discomfort, may use over-the-counter analgesics and warm compresses for support, may follow-up with urgent care as needed if symptoms persist or worsen Final Clinical Impressions(s) / UC Diagnoses   Final diagnoses:  Urinary urgency     Discharge Instructions      Your urinalysis was negative for infection, results may be skewed due to recent use of antibiotic, your urine has been sent to the lab to determine if bacteria will grow, if any changes need to take at that time  Begin use of Keflex every 6 hours for 5 days for additional bacterial coverage  You may use Pyridium every 8 hours for the next 2 days, this medicine helps to reduce urinary infection symptoms, it will turn your urine a  bright neon orange, this will resolve after use  May use Tylenol 500 to 1000 mg every 6 hours or ibuprofen 800 mg every 8 hours for discomfort  Increase your fluid intake through use of water  As always practice good hygiene, wiping front to back and avoidance of scented vaginal products to prevent further irritation  If symptoms continue to persist after use of medication or recur please follow-up with urgent care or your primary doctor as needed     ED  Prescriptions     Medication Sig Dispense Auth. Provider   cephALEXin (KEFLEX) 500 MG capsule Take 1 capsule (500 mg total) by mouth 4 (four) times daily. 20 capsule Keauna Brasel R, NP   phenazopyridine (PYRIDIUM) 200 MG tablet Take 1 tablet (200 mg total) by mouth 3 (three) times daily. 6 tablet Hans Eden, NP      PDMP not reviewed this encounter.   Hans Eden, NP 03/26/22 1507

## 2022-03-27 LAB — CERVICOVAGINAL ANCILLARY ONLY
Bacterial Vaginitis (gardnerella): NEGATIVE
Candida Glabrata: NEGATIVE
Candida Vaginitis: NEGATIVE
Comment: NEGATIVE
Comment: NEGATIVE
Comment: NEGATIVE

## 2022-03-27 LAB — URINE CULTURE: Culture: NO GROWTH

## 2023-07-04 ENCOUNTER — Encounter: Payer: Self-pay | Admitting: Allergy & Immunology

## 2023-07-04 ENCOUNTER — Other Ambulatory Visit: Payer: Self-pay

## 2023-07-04 ENCOUNTER — Ambulatory Visit (INDEPENDENT_AMBULATORY_CARE_PROVIDER_SITE_OTHER): Payer: BC Managed Care – PPO | Admitting: Allergy & Immunology

## 2023-07-04 VITALS — BP 122/78 | HR 66 | Temp 98.2°F | Ht 62.21 in | Wt 172.2 lb

## 2023-07-04 DIAGNOSIS — R21 Rash and other nonspecific skin eruption: Secondary | ICD-10-CM

## 2023-07-04 DIAGNOSIS — R221 Localized swelling, mass and lump, neck: Secondary | ICD-10-CM | POA: Diagnosis not present

## 2023-07-04 DIAGNOSIS — R220 Localized swelling, mass and lump, head: Secondary | ICD-10-CM

## 2023-07-04 DIAGNOSIS — J310 Chronic rhinitis: Secondary | ICD-10-CM

## 2023-07-04 NOTE — Patient Instructions (Addendum)
1. Swelling of lip, tongue, and throat - We are going to get some labs to look at a shellfish allergy panel. - We are also going to get an IgE level in case we want to start Xolair. - This is a medication that neutralizes allergy antibodies and allows patients to not worry AS MUCH as with cross contamination (it will be "cure" the allergy but liberalize the diet a bit).  - We are also going to look for overactive mast cells that can lead to worsening allergic reactions with a serum tryptase.  - This is likely normal, but we want to make sure.  - EpiPen training reviewed. - Emergency Action Plan provided.  - The current understanding is that shellfish ?llerg? is NOT a specific risk factors for ?o?tra?t reactions, so this is not necessarily a contraindication of getting contrast medium for imaging.   2. Chronic rhinitis - We will see you for testing in one week. - Unfortunately insurance companies are not covering the testing AND the New Patient visit on the same day. - We will know more once we do the testing and we can come up with a better plan at that time.   3. Rash - with  frequent breakouts - Continue following with the Dermatologist.  - We will just send the note to your primary care provider.   4. Return in about 1 week (around 07/11/2023) for ENVIRONMENTAL TESTING AND SHELLFISH TESTING (IF NEEDED).   Please inform us of any Emergency Department visits, hospitalizations, or changes in symptoms. Call us before going to the ED for breathing or allergy symptoms since we might be able to fit you in for a sick visit. Feel free to contact us anytime with any questions, problems, or concerns.  It was a pleasure to meet you today!  Websites that have reliable patient information: 1. American Academy of Asthma, Allergy, and Immunology: www.aaaai.org 2. Food Allergy Research and Education (FARE): foodallergy.org 3. Mothers of Asthmatics: http://www.asthmacommunitynetwork.org 4. American  College of Allergy, Asthma, and Immunology: www.acaai.org   COVID-19 Vaccine Information can be found at: [email or web address] For questions related to vaccine distribution or appointments, please email [email or web address] or call [phone].     "Like" [place] on Facebook and Instagram for our latest updates!      A healthy democracy works best when [employer] participate! Make sure you are registered to vote! If you have moved or changed any of your contact information, you will need to get this updated before voting! Scan the QR codes below to learn more!

## 2023-07-04 NOTE — Progress Notes (Signed)
NEW PATIENT  Date of Service/Encounter:  07/04/23  Consult requested by: Rayetta Humphrey, MD   Assessment:   Swelling of lip, tongue, and throat   Chronic rhinitis  Rash - sees Dr. Wallace Cullens  Plan/Recommendations:   1. Swelling of lip, tongue, and throat - We are going to get some labs to look at a shellfish allergy panel. - We are also going to get an IgE level in case we want to start Xolair. - This is a medication that neutralizes allergy antibodies and allows patients to not worry AS MUCH as with cross contamination (it will be "cure" the allergy but liberalize the diet a bit).  - We are also going to look for overactive mast cells that can lead to worsening allergic reactions with a serum tryptase.  - This is likely normal, but we want to make sure.  - EpiPen training reviewed. - Emergency Action Plan provided.  - The current understanding is that shellfish ?llerg? is NOT a specific risk factors for ?o?tra?t reactions, so this is not necessarily a contraindication of getting contrast medium for imaging.   2. Chronic rhinitis - We will see you for testing in one week. - Unfortunately insurance companies are not covering the testing AND the New Patient visit on the same day. - We will know more once we do the testing and we can come up with a better plan at that time.   3. Rash - with  frequent breakouts - Continue following with the Dermatologist.  - We will just send the note to your primary care provider.   4. Return in about 1 week (around 07/11/2023) for ENVIRONMENTAL TESTING AND SHELLFISH TESTING (IF NEEDED).  This note in its entirety was forwarded to the Provider who requested this consultation.  Subjective:   Angelica Moore is a 52 y.o. female presenting today for evaluation of  Chief Complaint  Patient presents with   Allergies    Food allergy to shellfish    Angelica Moore has a history of the following: Patient Active Problem List   Diagnosis  Date Noted   Cardiac murmur 01/01/2020   Eczema, dyshidrotic 05/27/2015   Vitamin D deficiency 04/22/2015   Heart palpitations 02/09/2015   Gastrointestinal food allergy syndrome 10/29/2014   Insomnia 10/29/2014   B12 deficiency 10/28/2014   Family history of breast cancer in mother 04/06/2013   Encounter for screening colonoscopy 04/02/2013    History obtained from: chart review and patient.  Discussed the use of AI scribe software for clinical note transcription with the patient and/or guardian, who gave verbal consent to proceed.  Angelica Moore was referred by Rayetta Humphrey, MD.     Angelica Moore is a 52 y.o. female presenting for an evaluation of environmental and food allergies .  The patient, a 52 year old with a known shellfish allergy, presents with a desire to explore new treatments for food allergies. The shellfish allergy was first diagnosed at the age of 74 following a severe reaction to lobster, which required emergency medical attention. Since then, the patient has had sporadic exposure to shellfish, each time resulting in wheezing and respiratory distress. The most recent exposure was approximately 10 years ago. The patient has also discovered that they can tolerate certain types of shellfish, specifically mollusks such as scallops and clams, without any adverse reactions.  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 They are able to consume fish without any issues. The patient  also reports environmental allergies, including allergies to certain plants, trees, grass, and certain breeds of dogs and cats.  The patient also presents with a recurrent skin condition, characterized by itchy, red welts that appear sporadically and take weeks to resolve. The first episode occurred in January, and the most recent episode started a few days prior to the consultation. The patient has sought dermatological care for this condition, but the cause remains  undetermined despite multiple biopsies. The patient denies any changes in diet or exposure to new substances that could have triggered these episodes.  For the management of allergic reactions, the patient uses over-the-counter antihistamines such as Benadryl and Zyrtec, and carries an EpiPen for emergencies. The patient is interested in exploring new treatments for their shellfish allergy.     Otherwise, there is no history of other atopic diseases, including drug allergies, stinging insect allergies, or contact dermatitis. There is no significant infectious history. Vaccinations are up to date.    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 If you have any questions, ask your nurse or doctor.          cephALEXin 500 MG capsule Commonly known as: KEFLEX Take 1 capsule (500 mg total) by mouth 4 (four) times daily.   cloNIDine 0.1 MG tablet Commonly known as: CATAPRES Take 0.1 mg by mouth daily.   ergocalciferol 1.25 MG (50000 UT) capsule Commonly known as: VITAMIN D2 Take as directed   MULTIVITAMIN ADULT PO Take by mouth.   phenazopyridine 200 MG tablet Commonly known as: PYRIDIUM Take 1 tablet (200 mg total) by mouth 3 (three) times daily.        Birth History: non-contributory  Developmental History: non-contributory  Past Surgical History: Past Surgical History:   Procedure Laterality Date   ABDOMINAL HYSTERECTOMY     CESAREAN SECTION     COLONOSCOPY WITH PROPOFOL N/A 01/25/2020   Procedure: COLONOSCOPY WITH PROPOFOL;  Surgeon: Toney Reil, MD;  Location: [facility] ENDOSCOPY;  Service: Gastroenterology;  Laterality: N/A;     Family History: Family History  Problem Relation Age of Onset   Eczema Father    Asthma Brother      Social History: Angelica Moore lives at home with her family.  She lives in a house that is 53 years old.  There is hardwood throughout the home.  She has a heat pump for heating and central cooling.  There are no animals inside or outside of the home.  There are dust mite covers on the bed as well as the pillows.  There is no tobacco exposure.  She currently works as an Production designer, theatre/television/film.  There are no fumes, chemicals, or dust exposure.  There is no HEPA filter in the home.  She does not [place] near an interstate or industrial area.   Review of systems otherwise negative other  than that mentioned in the HPI.    Objective:   Blood pressure 122/78, pulse 66, temperature 98.2 F (36.8 C), temperature source Temporal, height 5' 2.21" (1.58 m), weight 172 lb 3.2 oz (78.1 kg), SpO2 97%. Body mass index is 31.29 kg/m.     Physical Exam Vitals reviewed.  Constitutional:      Appearance: She is well-developed.  HENT:     Head: Normocephalic and atraumatic.     Right Ear: Tympanic membrane, ear canal and external ear normal. No drainage, swelling or tenderness. Tympanic membrane is not injected, scarred, erythematous, retracted or bulging.     Left Ear: Tympanic membrane, ear canal and external ear normal. No drainage, swelling or tenderness. Tympanic membrane is not injected, scarred, erythematous, retracted or bulging.     Nose: Mucosal edema and rhinorrhea present. No nasal deformity or septal deviation.     Right Turbinates: Enlarged, swollen and pale.     Left Turbinates: Enlarged, swollen and pale.     Right Sinus: No  maxillary sinus tenderness or frontal sinus tenderness.     Left Sinus: No maxillary sinus tenderness or frontal sinus tenderness.     Comments: No nasal polyps noted.     Mouth/Throat:     Lips: Pink.     Mouth: Mucous membranes are moist. Mucous membranes are not pale and not dry.     Pharynx: Uvula midline.     Comments: Cobblestoning present in the posterior oropharynx.  Eyes:     General: Allergic shiner present.        Right eye: No discharge.        Left eye: No discharge.     Conjunctiva/sclera: Conjunctivae normal.     Right eye: Right conjunctiva is not injected. No chemosis.    Left eye: Left conjunctiva is not injected. No chemosis.    Pupils: Pupils are equal, round, and reactive to light.  Cardiovascular:     Rate and Rhythm: Normal rate and regular rhythm.     Heart sounds: Normal heart sounds.  Pulmonary:     Effort: Pulmonary effort is normal. No tachypnea, accessory muscle usage or respiratory distress.     Breath sounds: Normal breath sounds. No wheezing, rhonchi or rales.     Comments: No increased work of breathing noted.  Chest:     Chest wall: No tenderness.  Abdominal:     Tenderness: There is no abdominal tenderness. There is no guarding or rebound.  Lymphadenopathy:     Head:     Right side of head: No submandibular, tonsillar or occipital adenopathy.     Left side of head: No submandibular, tonsillar or occipital adenopathy.     Cervical: No cervical adenopathy.  Skin:    General: Skin is warm.     Capillary Refill: Capillary refill takes less than 2 seconds.     Coloration: Skin is not pale.     Findings: No abrasion, erythema, petechiae or rash. Rash is not papular, urticarial or vesicular.     Comments: She does have a site on her left arm that is erythematous. This is where she recently had a biopsy performed.   Neurological:     Mental Status: She is alert.  Psychiatric:        Behavior: Behavior is cooperative.         Diagnostic  studies: labs sent instead          Malachi Bonds, MD [facility]

## 2023-07-07 LAB — ALLERGEN PROFILE, SHELLFISH
Clam IgE: <0.1 kU/L
F023-IgE Crab: 0.19 kU/L — AB
F080-IgE Lobster: 0.12 kU/L — AB
F290-IgE Oyster: <0.1 kU/L
Scallop IgE: 0.3 kU/L — AB
Shrimp IgE: 7.52 kU/L — AB

## 2023-07-07 LAB — TRYPTASE: Tryptase: 5.8 ug/L (ref 2.2–13.2)

## 2023-07-07 LAB — IGE: IgE (Immunoglobulin E), Serum: 240 [IU]/mL (ref 6–495)

## 2023-07-09 ENCOUNTER — Telehealth: Payer: Self-pay | Admitting: Allergy & Immunology

## 2023-07-09 MED ORDER — EPINEPHRINE 0.3 MG/0.3ML IJ SOAJ: 0.3000 mg | Freq: Once | INTRAMUSCULAR | 1 refills | Status: AC

## 2023-07-09 NOTE — Telephone Encounter (Signed)
Patient called stating she needs an Epi pen sent into [facility] in [place] on [address].

## 2023-07-09 NOTE — Telephone Encounter (Signed)
Sent in epipen to [facility] lm explaining I had sent in r

## 2023-07-11 ENCOUNTER — Telehealth: Payer: Self-pay | Admitting: *Deleted

## 2023-07-11 NOTE — Telephone Encounter (Signed)
L/m for patient to advise approval, copay card and submit to [employer] for xolair for food allergy indication

## 2023-07-11 NOTE — Telephone Encounter (Signed)
-----   Message from Alfonse Spruce sent at 07/09/2023  6:22 PM EDT ----- MyChart message sent. Can someone call and confirm that the patient received it?   Tewana Bohlen - I had discussed Xolair for her for her food allergy.

## 2023-07-18 ENCOUNTER — Ambulatory Visit (INDEPENDENT_AMBULATORY_CARE_PROVIDER_SITE_OTHER): Payer: Managed Care, Other (non HMO) | Admitting: Allergy & Immunology

## 2023-07-18 DIAGNOSIS — R221 Localized swelling, mass and lump, neck: Secondary | ICD-10-CM | POA: Diagnosis not present

## 2023-07-18 DIAGNOSIS — J302 Other seasonal allergic rhinitis: Secondary | ICD-10-CM | POA: Diagnosis not present

## 2023-07-18 DIAGNOSIS — J3089 Other allergic rhinitis: Secondary | ICD-10-CM | POA: Diagnosis not present

## 2023-07-18 DIAGNOSIS — R220 Localized swelling, mass and lump, head: Secondary | ICD-10-CM

## 2023-07-18 MED ORDER — LEVOCETIRIZINE DIHYDROCHLORIDE 5 MG PO TABS: 5.0000 mg | ORAL_TABLET | Freq: Every evening | ORAL | 5 refills | Status: AC

## 2023-07-18 MED ORDER — EPINEPHRINE 0.3 MG/0.3ML IJ SOAJ: 0.3000 mg | INTRAMUSCULAR | 1 refills | Status: AC | PRN

## 2023-07-18 NOTE — Patient Instructions (Addendum)
1. Swelling of lip, tongue, and throat - Testing was very elevated to shrimp with lower levels to other shellfish (results shown below).    - I would definitely avoid shrimp.  - We will plan for a LOBSTER challenge and avoid starting Xolair.  - EpiPen is up to date.  - Emergency Action Plan provided at the last visit.  - The current understanding is that shellfish ?llerg? is NOT a specific risk factors for ?o?tra?t reactions, so this is not necessarily a contraindication of getting contrast medium for imaging.   2. Chronic rhinitis - Testing today showed: grasses, ragweed, weeds, trees, indoor molds, outdoor molds, dust mites, cat, dog, and cockroach - Copy of test results provided.  - Avoidance measures provided. - Start taking: Xyzal (levocetirizine) 5mg  tablet once daily - You can use an extra dose of the antihistamine, if needed, for breakthrough symptoms.  - Consider nasal saline rinses 1-2 times daily to remove allergens from the nasal cavities as well as help with mucous clearance (this is especially helpful to do before the nasal sprays are given) - Consider allergy shots as a means of long-term control. - Allergy shots "re-train" and "reset" the immune system to ignore environmental allergens and decrease the resulting immune response to those allergens (sneezing, itchy watery eyes, runny nose, nasal congestion, etc).    - Allergy shots improve symptoms in 75-85% of patients.  - However, your symptoms do not seem severe enough to warrant allergy shots at this point in time.   3. Rash - with  frequent breakouts - Continue following with the Dermatologist.   4. Return in about 4 weeks (around 08/15/2023) for LOBSTER CHALLENGE (OK to schedule for NP). You can have the follow up appointment with Dr. Dellis Anes or a Nurse Practicioner (our Nurse Practitioners are excellent and always have Physician oversight!).    Please inform us of any Emergency Department visits, hospitalizations, or  changes in symptoms. Call us before going to the ED for breathing or allergy symptoms since we might be able to fit you in for a sick visit. Feel free to contact us anytime with any questions, problems, or concerns.  It was a pleasure to see you again today!  Websites that have reliable patient information: 1. American Academy of Asthma, Allergy, and Immunology: www.aaaai.org 2. Food Allergy Research and Education (FARE): foodallergy.org 3. Mothers of Asthmatics: http://www.asthmacommunitynetwork.org 4. American College of Allergy, Asthma, and Immunology: www.acaai.org      "Like" [place] on Facebook and Instagram for our latest updates!      A healthy democracy works best when [employer] participate! Make sure you are registered to vote! If you have moved or changed any of your contact information, you will need to get this updated before voting! Scan the QR codes below to learn more!        Airborne Adult Perc - 07/18/23 1452     Time Antigen Placed 1452    Allergen Manufacturer Waynette Buttery    Location Back    Number of Test 55    1. Control-Buffer 50% Glycerol Negative    2. Control-Histamine 2+    3. [place] 2+    4. [place] 2+    5. Johnson 2+    6. [place] Blue 3+    7. Meadow Fescue 3+    8. Perennial Rye 3+    9. Timothy 3+    10. Ragweed Mix Negative    11. Cocklebur Negative    12. Plantain,  English Negative  13. Baccharis Negative    14. Dog Fennel Negative    15. Russian Thistle Negative    16. Lamb's Quarters Negative    17. Sheep Sorrell Negative    18. Rough Pigweed Negative    19. Marsh Elder, Rough Negative    20. Mugwort, Common Negative    21. Box, Elder 2+    22. Cedar, red Negative    23. Sweet Gum Negative    24. Pecan Pollen 3+    25. Pine Mix 3+    26. Walnut, Black Pollen 3+    27. Red Mulberry Negative    28. Ash Mix Negative    29. Birch Mix Negative    30. Beech American Negative    31. Cottonwood, [place] Negative    32. Hickory, White  3+    33. Maple Mix Negative    34. Oak, [place] Mix 3+    35. Sycamore Eastern Negative    36. Alternaria Alternata Negative    37. Cladosporium Herbarum Negative    38. Aspergillus Mix Negative    39. Penicillium Mix Negative    40. Bipolaris Sorokiniana (Helminthosporium) Negative    41. Drechslera Spicifera (Curvularia) Negative    42. Mucor Plumbeus Negative    43. Fusarium Moniliforme Negative    44. Aureobasidium Pullulans (pullulara) Negative    45. Rhizopus Oryzae Negative    46. Botrytis Cinera Negative    47. Epicoccum Nigrum Negative    48. Phoma Betae Negative    49. Dust Mite Mix 3+    50. Cat Hair 10,000 BAU/ml 4+    51.  Dog Epithelia Negative    52. Mixed Feathers Negative    53. Horse Epithelia Negative    54. Cockroach, German Negative    55. Tobacco Leaf Negative             Intradermal - 07/18/23 1453     Time Antigen Placed 1500    Allergen Manufacturer Greer    Location Arm    Number of Test 9    Control Negative    Ragweed Mix 4+    Weed Mix 4+    Mold 1 2+    Mold 2 Negative    Mold 3 2+    Mold 4 2+    Dog 3+    Cockroach 3+             Food Adult Perc - 07/18/23 1400     Time Antigen Placed 1452    Allergen Manufacturer Waynette Buttery    Location Back    Number of allergen test 12    8. Shellfish Mix Negative    9. Fish Mix Negative    18. Trout Negative    19. Tuna Negative    20. Salmon Negative    21. Flounder Negative    22. Codfish Negative    23. Shrimp Negative    24. Crab Negative    25. Lobster Negative    26. Oyster Negative    27. Scallops Negative            Reducing Pollen Exposure  The American Academy of Allergy, Asthma and Immunology suggests the following steps to reduce your exposure to pollen during allergy seasons.    Do not hang sheets or clothing out to dry; pollen may collect on these items. Do not mow lawns or spend time around freshly cut grass; mowing stirs up pollen. Keep windows closed at  night.  Keep car windows closed  while driving. Minimize morning activities outdoors, a time when pollen counts are usually at their highest. Stay indoors as much as possible when pollen counts or humidity is high and on windy days when pollen tends to remain in the air longer. Use air conditioning when possible.  Many air conditioners have filters that trap the pollen spores. Use a HEPA room air filter to remove pollen form the indoor air you breathe.  Control of Mold Allergen   Mold and fungi can grow on a variety of surfaces provided certain temperature and moisture conditions exist.  Outdoor molds grow on plants, decaying vegetation and soil.  The major outdoor mold, Alternaria and Cladosporium, are found in very high numbers during hot and dry conditions.  Generally, a late Summer - Fall peak is seen for common outdoor fungal spores.  Rain will temporarily lower outdoor mold spore count, but counts rise rapidly when the rainy period ends.  The most important indoor molds are Aspergillus and Penicillium.  Dark, humid and poorly ventilated basements are ideal sites for mold growth.  The next most common sites of mold growth are the bathroom and the kitchen.  Outdoor (Seasonal) Mold Control   Use air conditioning and keep windows closed Avoid exposure to decaying vegetation. Avoid leaf raking. Avoid grain handling. Consider wearing a face mask if working in moldy areas.    Indoor (Perennial) Mold Control    Maintain humidity below 50%. Clean washable surfaces with 5% bleach solution. Remove sources e.g. contaminated carpets.    Control of Dust Mite Allergen    Dust mites play a major role in allergic asthma and rhinitis.  They occur in environments with high humidity wherever human skin is found.  Dust mites absorb humidity from the atmosphere (ie, they do not drink) and feed on organic matter (including shed human and animal skin).  Dust mites are a microscopic type of insect that  you cannot see with the naked eye.  High levels of dust mites have been detected from mattresses, pillows, carpets, upholstered furniture, bed covers, clothes, soft toys and any woven material.  The principal allergen of the dust mite is found in its feces.  A gram of dust may contain 1,000 mites and 250,000 fecal particles.  Mite antigen is easily measured in the air during house cleaning activities.  Dust mites do not bite and do not cause harm to humans, other than by triggering allergies/asthma.    Ways to decrease your exposure to dust mites in your home:  Encase mattresses, box springs and pillows with a mite-impermeable barrier or cover   Wash sheets, blankets and drapes weekly in hot water (130 F) with detergent and dry them in a dryer on the hot setting.  Have the room cleaned frequently with a vacuum cleaner and a damp dust-mop.  For carpeting or rugs, vacuuming with a vacuum cleaner equipped with a high-efficiency particulate air (HEPA) filter.  The dust mite allergic individual should not be in a room which is being cleaned and should wait 1 hour after cleaning before going into the room. Do not sleep on upholstered furniture (eg, couches).   If possible removing carpeting, upholstered furniture and drapery from the home is ideal.  Horizontal blinds should be eliminated in the rooms where the person spends the most time (bedroom, study, television room).  Washable vinyl, roller-type shades are optimal. Remove all non-washable stuffed toys from the bedroom.  Wash stuffed toys weekly like sheets and blankets above.   Reduce indoor  humidity to less than 50%.  Inexpensive humidity monitors can be purchased at most hardware stores.  Do not use a humidifier as can make the problem worse and are not recommended.  Control of Cockroach Allergen  Cockroach allergen has been identified as an important cause of acute attacks of asthma, especially in urban settings.  There are fifty-five species of  cockroach that exist in the [place], however only three, the [place], [place] species produce allergen that can affect patients with Asthma.  Allergens can be obtained from fecal particles, egg casings and secretions from cockroaches.    Remove food sources. Reduce access to water. Seal access and entry points. Spray runways with 0.5-1% Diazinon or Chlorpyrifos Blow boric acid power under stoves and refrigerator. Place bait stations (hydramethylnon) at feeding sites.  Allergy Shots  Allergies are the result of a chain reaction that starts in the immune system. Your immune system controls how your body defends itself. For instance, if you have an allergy to pollen, your immune system identifies pollen as an invader or allergen. Your immune system overreacts by producing antibodies called Immunoglobulin E (IgE). These antibodies travel to cells that release chemicals, causing an allergic reaction.  The concept behind allergy immunotherapy, whether it is received in the form of shots or tablets, is that the immune system can be desensitized to specific allergens that trigger allergy symptoms. Although it requires time and patience, the payback can be long-term relief. Allergy injections contain a dilute solution of those substances that you are allergic to based upon your skin testing and allergy history.   How Do Allergy Shots Work?  Allergy shots work much like a vaccine. Your body responds to injected amounts of a particular allergen given in increasing doses, eventually developing a resistance and tolerance to it. Allergy shots can lead to decreased, minimal or no allergy symptoms.  There generally are two phases: build-up and maintenance. Build-up often ranges from three to six months and involves receiving injections with increasing amounts of the allergens. The shots are typically given once or twice a week, though more rapid build-up schedules are sometimes used.  The  maintenance phase begins when the most effective dose is reached. This dose is different for each person, depending on how allergic you are and your response to the build-up injections. Once the maintenance dose is reached, there are longer periods between injections, typically two to four weeks.  Occasionally doctors give cortisone-type shots that can temporarily reduce allergy symptoms. These types of shots are different and should not be confused with allergy immunotherapy shots.  Who Can Be Treated with Allergy Shots?  Allergy shots may be a good treatment approach for people with allergic rhinitis (hay fever), allergic asthma, conjunctivitis (eye allergy) or stinging insect allergy.   Before deciding to begin allergy shots, you should consider:   The length of allergy season and the severity of your symptoms  Whether medications and/or changes to your environment can control your symptoms  Your desire to avoid long-term medication use  Time: allergy immunotherapy requires a major time commitment  Cost: may vary depending on your insurance coverage  Allergy shots for children age 66 and older are effective and often well tolerated. They might prevent the onset of new allergen sensitivities or the progression to asthma.  Allergy shots are not started on patients who are pregnant but can be continued on patients who become pregnant while receiving them. In some patients with other medical conditions or who take  certain common medications, allergy shots may be of risk. It is important to mention other medications you talk to your allergist.   What are the two types of build-ups offered:   RUSH or Rapid Desensitization -- one day of injections lasting from 8:30-4:30pm, injections every 1 hour.  Approximately half of the build-up process is completed in that one day.  The following week, normal build-up is resumed, and this entails ~16 visits either weekly or twice weekly, until reaching your  "maintenance dose" which is continued weekly until eventually getting spaced out to every month for a duration of 3 to 5 years. The regular build-up appointments are nurse visits where the injections are administered, followed by required monitoring for 30 minutes.    Traditional build-up -- weekly visits for 6 -12 months until reaching "maintenance dose", then continue weekly until eventually spacing out to every 4 weeks as above. At these appointments, the injections are administered, followed by required monitoring for 30 minutes.     Either way is acceptable, and both are equally effective. With the rush protocol, the advantage is that less time is spent here for injections overall AND you would also reach maintenance dosing faster (which is when the clinical benefit starts to become more apparent). Not everyone is a candidate for rapid desensitization.   IF we proceed with the RUSH protocol, there are premedications which must be taken the day before and the day after the rush only (this includes antihistamines, steroids, and Singulair).  After the rush day, no prednisone or Singulair is required, and we just recommend antihistamines taken on your injection day.  What Is An Estimate of the Costs?  If you are interested in starting allergy injections, please check with your insurance company about your coverage for both allergy vial sets and allergy injections.  Please do so prior to making the appointment to start injections.  The following are CPT codes to give to your insurance company. These are the amounts we BILL to the insurance company, but the amount YOU WILL PAY and WE RECEIVE IS SUBSTANTIALLY LESS and depends on the contracts we have with different insurance companies.   Amount Billed to Insurance One allergy vial set  CPT 95165   $ 1200     Two allergy vial set  CPT 95165   $ 2400     Three allergy vial set  CPT 95165   $ 3600     One injection   CPT 95115   $ 35  Two  injections   CPT 95117   $ 40 RUSH (Rapid Desensitization) CPT 95180 x 8 hours $500/hour  Regarding the allergy injections, your co-pay may or may not apply with each injection, so please confirm this with your insurance company. When you start allergy injections, 1 or 2 sets of vials are made based on your allergies.  Not all patients can be on one set of vials. A set of vials lasts 6 months to a year depending on how quickly you can proceed with your build-up of your allergy injections. Vials are personalized for each patient depending on their specific allergens.  How often are allergy injection given during the build-up period?   Injections are given at least weekly during the build-up period until your maintenance dose is achieved. Per the doctor's discretion, you may have the option of getting allergy injections two times per week during the build-up period. However, there must be at least 48 hours between injections. The build-up period is  usually completed within 6-12 months depending on your ability to schedule injections and for adjustments for reactions. When maintenance dose is reached, your injection schedule is gradually changed to every two weeks and later to every three weeks. Injections will then continue every 4 weeks. Usually, injections are continued for a total of 3-5 years.   When Will I Feel Better?  Some may experience decreased allergy symptoms during the build-up phase. For others, it may take as long as 12 months on the maintenance dose. If there is no improvement after a year of maintenance, your allergist will discuss other treatment options with you.  If you aren't responding to allergy shots, it may be because there is not enough dose of the allergen in your vaccine or there are missing allergens that were not identified during your allergy testing. Other reasons could be that there are high levels of the allergen in your environment or major exposure to non-allergic  triggers like tobacco smoke.  What Is the Length of Treatment?  Once the maintenance dose is reached, allergy shots are generally continued for three to five years. The decision to stop should be discussed with your allergist at that time. Some people may experience a permanent reduction of allergy symptoms. Others may relapse and a longer course of allergy shots can be considered.  What Are the Possible Reactions?  The two types of adverse reactions that can occur with allergy shots are local and systemic. Common local reactions include very mild redness and swelling at the injection site, which can happen immediately or several hours after. Report a delayed reaction from your last injection. These include arm swelling or runny nose, watery eyes or cough that occurs within 12-24 hours after injection. A systemic reaction, which is less common, affects the entire body or a particular body system. They are usually mild and typically respond quickly to medications. Signs include increased allergy symptoms such as sneezing, a stuffy nose or hives.   Rarely, a serious systemic reaction called anaphylaxis can develop. Symptoms include swelling in the throat, wheezing, a feeling of tightness in the chest, nausea or dizziness. Most serious systemic reactions develop within 30 minutes of allergy shots. This is why it is strongly recommended you wait in your doctor's office for 30 minutes after your injections. Your allergist is trained to watch for reactions, and his or her staff is trained and equipped with the proper medications to identify and treat them.   Report to the nurse immediately if you experience any of the following symptoms: swelling, itching or redness of the skin, hives, watery eyes/nose, breathing difficulty, excessive sneezing, coughing, stomach pain, diarrhea, or light headedness. These symptoms may occur within 15-20 minutes after injection and may require medication.   Who Should  Administer Allergy Shots?  The preferred location for receiving shots is your prescribing allergist's office. Injections can sometimes be given at another facility where the physician and staff are trained to recognize and treat reactions, and have received instructions by your prescribing allergist.  What if I am late for an injection?   Injection dose will be adjusted depending upon how many days or weeks you are late for your injection.   What if I am sick?   Please report any illness to the nurse before receiving injections. She may adjust your dose or postpone injections depending on your symptoms. If you have fever, flu, sinus infection or chest congestion it is best to postpone allergy injections until you are better. Never get  an allergy injection if your asthma is causing you problems. If your symptoms persist, seek out medical care to get your health problem under control.  What If I am or Become Pregnant:  Women that become pregnant should schedule an appointment with [facility] before receiving any further allergy injections.

## 2023-07-18 NOTE — Progress Notes (Signed)
FOLLOW UP  Date of Service/Encounter:  07/18/23   Assessment:   Food allergy (shrimp)   Chronic rhinitis   Rash - sees Dr. Wallace Cullens    Plan/Recommendations:   1. Swelling of lip, tongue, and throat - Testing was very elevated to shrimp with lower levels to other shellfish (results shown below).    - I would definitely avoid shrimp.  - We will plan for a LOBSTER challenge and avoid starting Xolair.  - EpiPen is up to date.  - Emergency Action Plan provided at the last visit.  - The current understanding is that shellfish ?llerg? is NOT a specific risk factors for ?o?tra?t reactions, so this is not necessarily a contraindication of getting contrast medium for imaging.   2. Chronic rhinitis - Testing today showed: grasses, ragweed, weeds, trees, indoor molds, outdoor molds, dust mites, cat, dog, and cockroach - Copy of test results provided.  - Avoidance measures provided. - Start taking: Xyzal (levocetirizine) 5mg  tablet once daily - You can use an extra dose of the antihistamine, if needed, for breakthrough symptoms.  - Consider nasal saline rinses 1-2 times daily to remove allergens from the nasal cavities as well as help with mucous clearance (this is especially helpful to do before the nasal sprays are given) - Consider allergy shots as a means of long-term control. - Allergy shots "re-train" and "reset" the immune system to ignore environmental allergens and decrease the resulting immune response to those allergens (sneezing, itchy watery eyes, runny nose, nasal congestion, etc).    - Allergy shots improve symptoms in 75-85% of patients.  - However, your symptoms do not seem severe enough to warrant allergy shots at this point in time.   3. Rash - with  frequent breakouts - Continue following with the Dermatologist.   4. Return in about 4 weeks (around 08/15/2023) for LOBSTER CHALLENGE (OK to schedule for NP). You can have the follow up appointment with Dr. Dellis Anes or a  Nurse Practicioner (our Nurse Practitioners are excellent and always have Physician oversight!).    Subjective:   Angelica Moore is a 52 y.o. female presenting today for follow up of No chief complaint on file.   Angelica Moore has a history of the following: Patient Active Problem List   Diagnosis Date Noted   Cardiac murmur 01/01/2020   Eczema, dyshidrotic 05/27/2015   Vitamin D deficiency 04/22/2015   Heart palpitations 02/09/2015   Gastrointestinal food allergy syndrome 10/29/2014   Insomnia 10/29/2014   B12 deficiency 10/28/2014   Family history of breast cancer in mother 04/06/2013   Encounter for screening colonoscopy 04/02/2013    History obtained from: chart review and patient.  Discussed the use of AI scribe software for clinical note transcription with the patient and/or guardian, who gave verbal consent to proceed.  Angelica Moore is a 52 y.o. female presenting for skin testing. She was last seen on October 24th. We could not do testing because her insurance company does not cover testing on the same day as a New Patient visit. She has been off of all antihistamines 3 days in anticipation of the testing.   At the last visit, we obtain labs to look for shellfish allergy.  She did end up having a very elevated IgE to shrimp with lower levels to crab, lobster, and scallop.  We obtained a serum tryptase which was normal.  EpiPen was reviewed.  For her rhinitis, we are going to be doing testing today.  Otherwise, there have been  no changes to her past medical history, surgical history, family history, or social history.    Review of systems otherwise negative other than that mentioned in the HPI.    Objective:   There were no vitals taken for this visit. There is no height or weight on file to calculate BMI.    Physical exam deferred since this was a skin testing appointment only.   Diagnostic studies:   Allergy Studies:     Airborne Adult Perc - 07/18/23 1452      Time Antigen Placed 1452    Allergen Manufacturer Waynette Buttery    Location Back    Number of Test 55    1. Control-Buffer 50% Glycerol Negative    2. Control-Histamine 2+    3. Bahia 2+    4. [place] 2+    5. Johnson 2+    6. Kentucky Blue 3+    7. Meadow Fescue 3+    8. Perennial Rye 3+    9. Timothy 3+    10. Ragweed Mix Negative    11. Cocklebur Negative    12. Plantain,  English Negative    13. Baccharis Negative    14. Dog Fennel Negative    15. Russian Thistle Negative    16. Lamb's Quarters Negative    17. Sheep Sorrell Negative    18. Rough Pigweed Negative    19. Marsh Elder, Rough Negative    20. Mugwort, Common Negative    21. Box, Elder 2+    22. Cedar, red Negative    23. Sweet Gum Negative    24. Pecan Pollen 3+    25. Pine Mix 3+    26. Walnut, Black Pollen 3+    27. Red Mulberry Negative    28. Ash Mix Negative    29. Birch Mix Negative    30. Beech American Negative    31. Cottonwood, [place] Negative    32. Hickory, White 3+    33. Maple Mix Negative    34. Oak, [place] Mix 3+    35. Sycamore Eastern Negative    36. Alternaria Alternata Negative    37. Cladosporium Herbarum Negative    38. Aspergillus Mix Negative    39. Penicillium Mix Negative    40. Bipolaris Sorokiniana (Helminthosporium) Negative    41. Drechslera Spicifera (Curvularia) Negative    42. Mucor Plumbeus Negative    43. Fusarium Moniliforme Negative    44. Aureobasidium Pullulans (pullulara) Negative    45. Rhizopus Oryzae Negative    46. Botrytis Cinera Negative    47. Epicoccum Nigrum Negative    48. Phoma Betae Negative    49. Dust Mite Mix 3+    50. Cat Hair 10,000 BAU/ml 4+    51.  Dog Epithelia Negative    52. Mixed Feathers Negative    53. Horse Epithelia Negative    54. Cockroach, German Negative    55. Tobacco Leaf Negative             Intradermal - 07/18/23 1453     Time Antigen Placed 1500    Allergen Manufacturer Waynette Buttery    Location Arm    Number of Test  9    Control Negative    Ragweed Mix 4+    Weed Mix 4+    Mold 1 2+    Mold 2 Negative    Mold 3 2+    Mold 4 2+    Dog 3+    Cockroach 3+  Food Adult Perc - 07/18/23 1400     Time Antigen Placed 1452    Allergen Manufacturer Waynette Buttery    Location Back    Number of allergen test 12    8. Shellfish Mix Negative    9. Fish Mix Negative    18. Trout Negative    19. Tuna Negative    20. Salmon Negative    21. Flounder Negative    22. Codfish Negative    23. Shrimp Negative    24. Crab Negative    25. Lobster Negative    26. Oyster Negative    27. Scallops Negative             Allergy testing results were read and interpreted by myself, documented by clinical staff.      Malachi Bonds, MD  [facility]

## 2023-07-19 NOTE — Telephone Encounter (Signed)
Spoke to patient and advised submit will reach out once delivery to make appt to start therapy

## 2023-07-19 NOTE — Telephone Encounter (Signed)
Thanks Angelica Moore 

## 2023-07-20 ENCOUNTER — Encounter: Payer: Self-pay | Admitting: Allergy & Immunology

## 2023-08-01 ENCOUNTER — Other Ambulatory Visit: Payer: Self-pay

## 2023-08-01 ENCOUNTER — Ambulatory Visit (INDEPENDENT_AMBULATORY_CARE_PROVIDER_SITE_OTHER): Payer: BC Managed Care – PPO | Admitting: Allergy & Immunology

## 2023-08-01 ENCOUNTER — Encounter: Payer: Self-pay | Admitting: Allergy & Immunology

## 2023-08-01 VITALS — BP 138/90 | HR 65 | Resp 16

## 2023-08-01 DIAGNOSIS — R220 Localized swelling, mass and lump, head: Secondary | ICD-10-CM | POA: Diagnosis not present

## 2023-08-01 DIAGNOSIS — R221 Localized swelling, mass and lump, neck: Secondary | ICD-10-CM

## 2023-08-01 NOTE — Progress Notes (Signed)
FOLLOW UP  Date of Service/Encounter:  08/01/23   Assessment:   Food allergy (shrimp)   Perennial and seasonal allergic rhinitis (grasses, ragweed, weeds, trees, indoor molds, outdoor molds, dust mites, cat, dog, and cockroach)   Rash - sees Dr. Wallace Cullens  Plan/Recommendations:    1. Swelling of lip, tongue, and throat - You tolerated the lobster challenge today. - I think you can safely introduce crab and scallop at home since you did so well with the lobster. - Keep the EpiPen handy just in case.  - AVOID SHRIMP and be sure to tell restaurant staff that you are allergic (so they can cook the foods separately). - I think that we can avoid the Xolair for now.  - EpiPen is up to date.  - Emergency Action Plan provided at the last visit.  - The current understanding is that shellfish ?llerg? is NOT a specific risk factors for ?o?tra?t reactions, so this is not necessarily a contraindication of getting contrast medium for imaging.   2. Chronic rhinitis - Previous testing showed: grasses, ragweed, weeds, trees, indoor molds, outdoor molds, dust mites, cat, dog, and cockroach - Copy of test results provided.  - Avoidance measures provided. - Continue taking: Xyzal (levocetirizine) 5mg  tablet once daily - You can use an extra dose of the antihistamine, if needed, for breakthrough symptoms.  - Consider nasal saline rinses 1-2 times daily to remove allergens from the nasal cavities as well as help with mucous clearance (this is especially helpful to do before the nasal sprays are given) - Consider allergy shots as a means of long-term control. - Allergy shots "re-train" and "reset" the immune system to ignore environmental allergens and decrease the resulting immune response to those allergens (sneezing, itchy watery eyes, runny nose, nasal congestion, etc).    - Allergy shots improve symptoms in 75-85% of patients.  - However, your symptoms do not seem severe enough to warrant allergy  shots at this point in time.   3. Rash - with  frequent breakouts - Continue following with the Dermatologist.   4. Return in about 1 year (around 07/31/2024).    Subjective:   Angelica Moore is a 52 y.o. female presenting today for follow up of No chief complaint on file.   Angelica Moore has a history of the following: Patient Active Problem List   Diagnosis Date Noted   Cardiac murmur 01/01/2020   Eczema, dyshidrotic 05/27/2015   Vitamin D deficiency 04/22/2015   Heart palpitations 02/09/2015   Gastrointestinal food allergy syndrome 10/29/2014   Insomnia 10/29/2014   B12 deficiency 10/28/2014   Family history of breast cancer in mother 04/06/2013   Encounter for screening colonoscopy 04/02/2013    History obtained from: chart review and patient.  Discussed the use of AI scribe software for clinical note transcription with the patient and/or guardian, who gave verbal consent to proceed.  Angelica Moore is a 52 y.o. female presenting for a food challenge.  She was last seen in November 2020 for.  At that time, she was very interested in doing a lobster challenge.  Lab work was confirmatory for a very high IgE to shrimp with lower levels to other shellfish including an IgE of 0.12 to lobster.  She had testing that was positive to multiple indoor and outdoor allergens.  We started her on Xyzal as well as nasal saline rinses.  We did talk about allergy shots.  She continued to follow with Dr. Wallace Cullens, her dermatologist, for her rash.  Since last visit, she has been well.  She did bring in a whole lobster for the challenge today.  She is feeling good and has been off of her antihistamines.  Otherwise, there have been no changes to her past medical history, surgical history, family history, or social history.    Review of systems otherwise negative other than that mentioned in the HPI.    Objective:   Blood pressure (!) 138/90, pulse 65, resp. rate 16, SpO2 96%. There is no  height or weight on file to calculate BMI.   Physical exam deferred since this was a challenge appointment only.   Diagnostic studies:   Allergy Studies:     Oral Challenge - 08/01/23 1100     Challenge Food/Drug Lobster    Food/Drug provided by patient    BP 138/90    Pulse 65    Respirations 16    Lungs 96    Skin clear    Mouth clear    Time 0907    Dose lip rub    Time 0926    Dose 1 gm    Time 0942    Dose 2 gm    Time 0959    Dose 4 gm    Time 1016    Dose 6 gm    Time 1033    Dose 8 gm    BP 148/98    Pulse 60    Respirations 12    Lungs 99    Skin clear    Mouth clear             Patient was monitored for 1 hour following the last dose and did very well with this.  The remainder of her doses were separated by 15 minutes.     Angelica Bonds, MD  [facility]

## 2023-08-01 NOTE — Patient Instructions (Addendum)
1. Swelling of lip, tongue, and throat - You tolerated the lobster challenge today. - I think you can safely introduce crab and scallop at home since you did so well with the lobster. - Keep the EpiPen handy just in case.  - AVOID SHRIMP and be sure to tell restaurant staff that you are allergic (so they can cook the foods separately). - I think that we can avoid the Xolair for now.  - EpiPen is up to date.  - Emergency Action Plan provided at the last visit.  - The current understanding is that shellfish ?llerg? is NOT a specific risk factors for ?o?tra?t reactions, so this is not necessarily a contraindication of getting contrast medium for imaging.   2. Chronic rhinitis - Previous testing showed: grasses, ragweed, weeds, trees, indoor molds, outdoor molds, dust mites, cat, dog, and cockroach - Copy of test results provided.  - Avoidance measures provided. - Continue taking: Xyzal (levocetirizine) 5mg  tablet once daily - You can use an extra dose of the antihistamine, if needed, for breakthrough symptoms.  - Consider nasal saline rinses 1-2 times daily to remove allergens from the nasal cavities as well as help with mucous clearance (this is especially helpful to do before the nasal sprays are given) - Consider allergy shots as a means of long-term control. - Allergy shots "re-train" and "reset" the immune system to ignore environmental allergens and decrease the resulting immune response to those allergens (sneezing, itchy watery eyes, runny nose, nasal congestion, etc).    - Allergy shots improve symptoms in 75-85% of patients.  - However, your symptoms do not seem severe enough to warrant allergy shots at this point in time.   3. Rash - with  frequent breakouts - Continue following with the Dermatologist.   4. Return in about 1 year (around 07/31/2024).    Please inform us of any Emergency Department visits, hospitalizations, or changes in symptoms. Call us before going to the ED for  breathing or allergy symptoms since we might be able to fit you in for a sick visit. Feel free to contact us anytime with any questions, problems, or concerns.  It was a pleasure to see you again today!  Websites that have reliable patient information: 1. American Academy of Asthma, Allergy, and Immunology: www.aaaai.org 2. Food Allergy Research and Education (FARE): foodallergy.org 3. Mothers of Asthmatics: http://www.asthmacommunitynetwork.org 4. American College of Allergy, Asthma, and Immunology: www.acaai.org      "Like" [place] on Facebook and Instagram for our latest updates!      A healthy democracy works best when [employer] participate! Make sure you are registered to vote! If you have moved or changed any of your contact information, you will need to get this updated before voting! Scan the QR codes below to learn more!

## 2023-08-02 NOTE — Addendum Note (Signed)
Addended by: Kellie Simmering, Bartlett Enke on: 08/02/2023 05:19 PM   Modules accepted: Orders

## 2023-09-18 ENCOUNTER — Telehealth: Payer: Self-pay

## 2023-09-18 NOTE — Telephone Encounter (Signed)
 Received form from [facility] for request of additional information as labs done were denied by the ins company. Dr.Joel Gallagher,MD has updated dx codes and it has been faxed to [phone]. Form has been placed into bulk scanning.

## 2023-11-25 ENCOUNTER — Emergency Department
Admission: EM | Admit: 2023-11-25 | Discharge: 2023-11-25 | Disposition: A | Discharge status: Home / Self Care | Attending: Emergency Medicine | Admitting: Emergency Medicine

## 2023-11-25 ENCOUNTER — Encounter: Payer: Self-pay | Admitting: Emergency Medicine

## 2023-11-25 ENCOUNTER — Other Ambulatory Visit: Payer: Self-pay

## 2023-11-25 DIAGNOSIS — T7840XA Allergy, unspecified, initial encounter: Secondary | ICD-10-CM | POA: Diagnosis present

## 2023-11-25 DIAGNOSIS — T782XXA Anaphylactic shock, unspecified, initial encounter: Secondary | ICD-10-CM

## 2023-11-25 MED ORDER — METHYLPREDNISOLONE SODIUM SUCC 125 MG IJ SOLR
125.0000 mg | Freq: Once | INTRAMUSCULAR | Status: AC
  Administered 2023-11-25: 125 mg via INTRAVENOUS
  Filled 2023-11-25: qty 2

## 2023-11-25 MED ORDER — EPINEPHRINE 0.3 MG/0.3ML IJ SOAJ: 0.3000 mg | INTRAMUSCULAR | 0 refills | Status: DC | PRN

## 2023-11-25 MED ORDER — FAMOTIDINE IN NACL 20-0.9 MG/50ML-% IV SOLN
20.0000 mg | Freq: Once | INTRAVENOUS | Status: AC
  Administered 2023-11-25: 20 mg via INTRAVENOUS
  Filled 2023-11-25: qty 50

## 2023-11-25 MED ORDER — ONDANSETRON HCL 4 MG/2ML IJ SOLN
4.0000 mg | Freq: Once | INTRAMUSCULAR | Status: AC
  Administered 2023-11-25: 4 mg via INTRAVENOUS
  Filled 2023-11-25: qty 2

## 2023-11-25 MED ORDER — DIPHENHYDRAMINE HCL 50 MG/ML IJ SOLN
25.0000 mg | Freq: Once | INTRAMUSCULAR | Status: AC
  Administered 2023-11-25: 25 mg via INTRAVENOUS
  Filled 2023-11-25: qty 1

## 2023-11-25 NOTE — Discharge Instructions (Addendum)
 You were seen in the emergency department today for evaluation of your allergic reaction.  Please avoid eating lobster or other shellfish.  I have sent a prescription for 2 EpiPen's to your pharmacy.  Please pick these up and have them readily available with you at all times.  Follow with your primary care doctor for further evaluation.  Return to the ER for any new or worsening symptoms.

## 2023-11-25 NOTE — ED Triage Notes (Signed)
 Pt to ED via POV with c/o allergic reaction. Pt states known hx of allergies to shellfish, states ate lobster last night at at approx 1900, at approx 0200 woke up with ithing, burning, SHOB. Pt states took home epipen approx 1 hr PTA. Pt able to speak in full sentences, some noted [facility] with speaking, however pt's voice noted to be clear on assessment.   Pt c/o feeling nauseated and feeling weak.  Pt also reports she is unsure if her husband administered her epipen correctly.

## 2023-11-25 NOTE — ED Provider Notes (Signed)
 [facility] Provider Note    Event Date/Time   First MD Initiated Contact with Patient 11/25/23 0308     (approximate)   History   Allergic Reaction   HPI  Angelica Moore is a 53 year old female presenting to the emergency department for evaluation of allergic reaction.  History of shellfish allergy, but saw a allergist and thought that she was only allergic to shrimp.  Around 7 PM last night, ate some lobster.  At 2 AM, she woke up with an itchy rash with burning sensation, shortness of breath, nausea and vomiting.  Took a home EpiPen around 1:30 AM with improvement in her symptoms, but some ongoing nausea and burning sensation of her skin.    Physical Exam   Triage Vital Signs: ED Triage Vitals  Encounter Vitals Group     BP 11/25/23 0313 117/75     Systolic BP Percentile --      Diastolic BP Percentile --      Pulse Rate 11/25/23 0311 62     Resp 11/25/23 0313 16     Temp 11/25/23 0313 97.7 F (36.5 C)     Temp Source 11/25/23 0313 Oral     SpO2 11/25/23 0311 100 %     Weight 11/25/23 0310 170 lb (77.1 kg)     Height 11/25/23 0310 5\' 1"  (1.549 m)     Head Circumference --      Peak Flow --      Pain Score 11/25/23 0309 0     Pain Loc --      Pain Education --      Exclude from Growth Chart --     Most recent vital signs: Vitals:   11/25/23 0313 11/25/23 0500  BP: 117/75   Pulse: 61 66  Resp: 16 14  Temp: 97.7 F (36.5 C)   SpO2: 98% 99%     General: Awake, interactive  HEENT: No significant visible intraoral swelling, speaking in full sentences without difficulty. CV:  Regular rate, good peripheral perfusion.  Resp:  Unlabored respirations, lungs clear to auscultation Abd:  Nondistended, soft, nontender to palpation Neuro:  Symmetric facial movement, fluid speech Skin:  Scattered areas of hypopigmentation that patient reports are longstanding.  A few areas of scattered erythema and induration consistent with  urticaria.   ED Results / Procedures / Treatments   Labs (all labs ordered are listed, but only abnormal results are displayed) Labs Reviewed - No data to display   EKG EKG independently reviewed interpreted by myself (ER attending) demonstrates:  EKG demonstrates sinus rhythm rate of 59, PR 166, QRS 98, QTc 439, no acute ST changes  RADIOLOGY Imaging independently reviewed and interpreted by myself demonstrates:    PROCEDURES:  Critical Care performed: No  Procedures   MEDICATIONS ORDERED IN ED: Medications  diphenhydrAMINE (BENADRYL) injection 25 mg (25 mg Intravenous Given 11/25/23 0340)  ondansetron (ZOFRAN) injection 4 mg (4 mg Intravenous Given 11/25/23 0339)  methylPREDNISolone sodium succinate (SOLU-MEDROL) 125 mg/2 mL injection 125 mg (125 mg Intravenous Given 11/25/23 0340)  famotidine (PEPCID) IVPB 20 mg premix (0 mg Intravenous Stopped 11/25/23 0410)     IMPRESSION / MDM / ASSESSMENT AND PLAN / ED COURSE  I reviewed the triage vital signs and the nursing notes.  Differential diagnosis includes, but is not limited to, anaphylaxis secondary to shellfish exposure, not anaphylactic reaction, lower suspicion viral illness  Patient's presentation is most consistent with acute presentation with potential threat to life or  bodily function.  53 year old female presenting with allergic reaction after exposure to shellfish.  Stable vitals on presentation.  Received EpiPen prior to presentation with improvement.  Clinical history does seem consistent with anaphylaxis given reported shortness of breath, GI symptoms, hives.  Will give adjuncts including Benadryl, steroids, famotidine and plan to observe for at least 4 hours from EpiPen administration.  Clinical Course as of 11/25/23 0531  Mon Nov 25, 2023  0431 Reassessed.  Reports symptoms are improving, but not completely resolved.  No worsening symptoms. [NR]  [id number] Patient reassessed.  Continues to feel improved and is now  4 hours out from receiving epi pen.  She is comfortable discharge home.  Does not have an EpiPen available still, so will discharge with prescription for 2.  Strict return precautions provided.  Patient discharged in stable condition. [NR]    Clinical Course User Index [NR] Trinna Post, MD     FINAL CLINICAL IMPRESSION(S) / ED DIAGNOSES   Final diagnoses:  Anaphylaxis, initial encounter     Rx / DC Orders   ED Discharge Orders          Ordered    EPINEPHrine 0.3 mg/0.3 mL IJ SOAJ injection  As needed        11/25/23 0529             Note:  This document was prepared using Dragon voice recognition software and may include unintentional dictation errors.   Trinna Post, MD 11/25/23 [phone]

## 2023-12-11 ENCOUNTER — Telehealth: Payer: Self-pay | Admitting: *Deleted

## 2023-12-11 NOTE — Telephone Encounter (Signed)
 Spoke to patient and she wants to possibly start Xolair, I advised her she would need updated MD appt since Dr Dellis Anes advised she did not need [place] [place] for food allergy in last note and she has new Ins. She has had another allergic reaction since. Made appt with I thought Dr Dellis Anes but turned out to be Dr Selena Batten and l/m for patient to call to reschedule due to this error

## 2023-12-11 NOTE — Telephone Encounter (Signed)
 Patient called and l/m for me to call. I reached out and l/m for her to contact me

## 2023-12-16 ENCOUNTER — Ambulatory Visit: Admitting: Allergy

## 2023-12-17 ENCOUNTER — Ambulatory Visit: Payer: Self-pay | Admitting: *Deleted

## 2023-12-17 NOTE — Telephone Encounter (Signed)
  Chief Complaint: left knee pain after fall from allergic reaction to shell fish hitting on knee on tile floor Symptoms: left knee pain and now behind knee pain. Pain walking on stairs and down. Has been taking tylenol and wearing ace wrap . Not sleeping well pain level up to 6-7/10. Dull pain can throb. Frequency: couple of weeks ago  Pertinent Negatives: Patient denies fever no redness no swelling  Disposition: [] ED /[x] Urgent Care (no appt availability in office) / [] Appointment(In office/virtual)/ []  [place] [facility]/ [] Home Care/ [] Refused Recommended Disposition /[]  Mobile Bus/ []  Follow-up with PCP Additional Notes:   Patient requesting appt with any provider at [facility]. Patient PCP at [facility]. Recommended [facility] and patient reports she will go to ortho UC in [place].     Copied from CRM [id number]. Topic: Clinical - Red Word Triage >> Dec 17, 2023  8:42 AM Mackie Pai E wrote: [facility] that prompted transfer to Nurse Triage: Throbbing pain in left knee. Patient stated she fell a couple weeks ago and her knee has been in pain since. Patient rated the pain a level 6/7 out of 10, she is starting to have trouble walking. Patient also woke up this morning with pain behind her knee, which was new. Reason for Disposition  [1] MODERATE pain (e.g., interferes with normal activities, limping) AND [2] present > 3 days  Answer Assessment - Initial Assessment Questions 1. LOCATION and RADIATION: "Where is the pain located?"      Left knee pain after a fall, allergic reaction to shell fish and fell on knee 2. QUALITY: "What does the pain feel like?"  (e.g., sharp, dull, aching, burning)     Dull pain comes and goes  3. SEVERITY: "How bad is the pain?" "What does it keep you from doing?"   (Scale 1-10; or mild, moderate, severe)   -  MILD (1-3): doesn't interfere with normal activities    -  MODERATE (4-7): interferes with normal activities (e.g., work or school) or awakens from  sleep, limping    -  SEVERE (8-10): excruciating pain, unable to do any normal activities, unable to walk     Moderate 6-7/10 not sleeping well 4. ONSET: "When did the pain start?" "Does it come and go, or is it there all the time?"     Couple of weeks ago  5. RECURRENT: "Have you had this pain before?" If Yes, ask: "When, and what happened then?"    No  6. SETTING: "Has there been any recent work, exercise or other activity that involved that part of the body?"      Fell on tile floor after allergic reaction to shell fish 7. AGGRAVATING FACTORS: "What makes the knee pain worse?" (e.g., walking, climbing stairs, running)     Climbing stairs  8. ASSOCIATED SYMPTOMS: "Is there any swelling or redness of the knee?"     Na  9. OTHER SYMPTOMS: "Do you have any other symptoms?" (e.g., chest pain, difficulty breathing, fever, calf pain)     Left knee pain and now behind knee painful  10. PREGNANCY: "Is there any chance you are pregnant?" "When was your last menstrual period?"       na  Protocols used: Knee Pain-A-AH

## 2023-12-19 ENCOUNTER — Ambulatory Visit (INDEPENDENT_AMBULATORY_CARE_PROVIDER_SITE_OTHER): Admitting: Allergy & Immunology

## 2023-12-19 VITALS — BP 124/70 | HR 76 | Temp 98.1°F | Resp 14 | Ht 61.25 in | Wt 181.6 lb

## 2023-12-19 DIAGNOSIS — T7802XD Anaphylactic reaction due to shellfish (crustaceans), subsequent encounter: Secondary | ICD-10-CM

## 2023-12-19 DIAGNOSIS — J302 Other seasonal allergic rhinitis: Secondary | ICD-10-CM

## 2023-12-19 DIAGNOSIS — J3089 Other allergic rhinitis: Secondary | ICD-10-CM

## 2023-12-19 MED ORDER — KETOCONAZOLE 2 % EX SHAM: MEDICATED_SHAMPOO | CUTANEOUS | 3 refills | Status: AC

## 2023-12-19 MED ORDER — FLUCONAZOLE 200 MG PO TABS: ORAL_TABLET | ORAL | 0 refills | Status: DC

## 2023-12-19 NOTE — Progress Notes (Unsigned)
 FOLLOW UP  Date of Service/Encounter:  12/19/23   Assessment:   Food allergy (shrimp)   Perennial and seasonal allergic rhinitis (grasses, ragweed, weeds, trees, indoor molds, outdoor molds, dust mites, cat, dog, and cockroach)   Rash - sees Dr. Wallace Cullens  Plan/Recommendations:   Assessment and Plan Assessment & Plan      There are no Patient Instructions on file for this visit.   Subjective:   Angelica Moore is a 53 y.o. female presenting today for follow up of No chief complaint on file.   Angelica Moore has a history of the following: Patient Active Problem List   Diagnosis Date Noted   Cardiac murmur 01/01/2020   Eczema, dyshidrotic 05/27/2015   Vitamin D deficiency 04/22/2015   Heart palpitations 02/09/2015   Gastrointestinal food allergy syndrome 10/29/2014   Insomnia 10/29/2014   B12 deficiency 10/28/2014   Family history of breast cancer in mother 04/06/2013   Encounter for screening colonoscopy 04/02/2013    History obtained from: chart review and {Persons; PED relatives w/patient:19415::"patient"}.  Discussed the use of AI scribe software for clinical note transcription with the patient and/or guardian, who gave verbal consent to proceed.  Angelica Moore is a 53 y.o. female presenting for {Blank single:19197::"a food challenge","a drug challenge","skin testing","a sick visit","an evaluation of ***","a follow up visit"}.  She was last seen in November 2024.  At that time, she did well with her lobster challenge.  We can recommended that she continue to avoid shrimp.  Her EpiPen is up-to-date.  We updated her emergency action plan as well.  For her rhinitis, she had multiple environmental allergies that were positive on skin testing, both indoor and outdoor.  We continue with Xyzal 5 mg daily.  Her symptoms never seem severe enough to warrant allergen immunotherapy.  In the interim, it looks like she was seen in the emergency room on November 25, 2023.  This was  for an allergic reaction.  She received Zofran, Benadryl, methylprednisolone, and famotidine.  She apparently had eaten shrimp at that time as well.    Discussed the use of AI scribe software for clinical note transcription with the patient, who gave verbal consent to proceed.  History of Present Illness     Asthma/Respiratory Symptom History: ***  Allergic Rhinitis Symptom History: ***  Food Allergy Symptom History: ***    Skin Symptom History: ***  GERD Symptom History: ***  Infection Symptom History: ***  Otherwise, there have been no changes to her past medical history, surgical history, family history, or social history.    Review of systems otherwise negative other than that mentioned in the HPI.    Objective:   There were no vitals taken for this visit. There is no height or weight on file to calculate BMI.    Physical Exam   Diagnostic studies: {Blank single:19197::"none","deferred due to recent antihistamine use","deferred due to insurance stipulations that require a separate visit for testing","labs sent instead"," "}  Spirometry: {Blank single:19197::"results normal (FEV1: ***%, FVC: ***%, FEV1/FVC: ***%)","results abnormal (FEV1: ***%, FVC: ***%, FEV1/FVC: ***%)"}.    {Blank single:19197::"Spirometry consistent with mild obstructive disease","Spirometry consistent with moderate obstructive disease","Spirometry consistent with severe obstructive disease","Spirometry consistent with possible restrictive disease","Spirometry consistent with mixed obstructive and restrictive disease","Spirometry uninterpretable due to technique","Spirometry consistent with normal pattern"}. {Blank single:19197::"Albuterol/Atrovent nebulizer","Xopenex/Atrovent nebulizer","Albuterol nebulizer","Albuterol four puffs via MDI","Xopenex four puffs via MDI"} treatment given in clinic with {Blank single:19197::"significant improvement in FEV1 per ATS criteria","significant improvement in  FVC per ATS criteria","significant improvement  in FEV1 and FVC per ATS criteria","improvement in FEV1, but not significant per ATS criteria","improvement in FVC, but not significant per ATS criteria","improvement in FEV1 and FVC, but not significant per ATS criteria","no improvement"}.  Allergy Studies: {Blank single:19197::"none","deferred due to recent antihistamine use","deferred due to insurance stipulations that require a separate visit for testing","labs sent instead"," "}    {Blank single:19197::"Allergy testing results were read and interpreted by myself, documented by clinical staff."," "}      Malachi Bonds, MD  [facility]

## 2023-12-19 NOTE — Patient Instructions (Addendum)
 1. Shellfish allergy - Let's go ahead and start Xolair, which might prevent any reactions to cross contamination with shrimp.  - Consent signed today for Xolair. - Tammy will reach out to you to discuss the approval process.  - AVOID SHRIMP and be sure to tell restaurant staff that you are allergic (so they can cook the foods separately). - EpiPen is up to date.  - Emergency Action Plan updated.   2. Chronic rhinitis - Previous testing showed: grasses, ragweed, weeds, trees, indoor molds, outdoor molds, dust mites, cat, dog, and cockroach - Copy of test results provided.  - Continue taking: Xyzal (levocetirizine) 5mg  tablet once daily - You can use an extra dose of the antihistamine, if needed, for breakthrough symptoms.  - Consider nasal saline rinses 1-2 times daily to remove allergens from the nasal cavities as well as help with mucous clearance (this is especially helpful to do before the nasal sprays are given) - Consider allergy shots as a means of long-term control. - Allergy shots "re-train" and "reset" the immune system to ignore environmental allergens and decrease the resulting immune response to those allergens (sneezing, itchy watery eyes, runny nose, nasal congestion, etc).    - Allergy shots improve symptoms in 75-85% of patients.  - However, your symptoms do not seem severe enough to warrant allergy shots at this point in time.   3. Rash - possibly tinea versicolor - This is caused by a fungus in the skin that attacks the melanocytes. - Start Diflucan 200mg  weekly for 6 weeks.   4. Return in about 6 weeks (around 01/30/2024).    Please inform us of any Emergency Department visits, hospitalizations, or changes in symptoms. Call us before going to the ED for breathing or allergy symptoms since we might be able to fit you in for a sick visit. Feel free to contact us anytime with any questions, problems, or concerns.  It was a pleasure to see you again today!  Websites that  have reliable patient information: 1. American Academy of Asthma, Allergy, and Immunology: www.aaaai.org 2. Food Allergy Research and Education (FARE): foodallergy.org 3. Mothers of Asthmatics: http://www.asthmacommunitynetwork.org 4. American College of Allergy, Asthma, and Immunology: www.acaai.org      "Like" [place] on Facebook and Instagram for our latest updates!      A healthy democracy works best when [employer] participate! Make sure you are registered to vote! If you have moved or changed any of your contact information, you will need to get this updated before voting! Scan the QR codes below to learn more!

## 2023-12-20 ENCOUNTER — Encounter: Payer: Self-pay | Admitting: Allergy & Immunology

## 2023-12-24 ENCOUNTER — Telehealth: Payer: Self-pay | Admitting: *Deleted

## 2023-12-24 NOTE — Telephone Encounter (Signed)
-----   Message from [place] Chuck sent at 12/20/2023  8:38 AM EDT ----- Consent signed for Xolair for FA

## 2023-12-24 NOTE — Telephone Encounter (Signed)
L/m for patient to contact me to advise approval, copay card and submit to [employer] for Xolair 

## 2023-12-25 NOTE — Telephone Encounter (Signed)
 Patient advised of submit and will reach out once delivery set to make appt to start Xolair

## 2023-12-26 NOTE — Telephone Encounter (Signed)
 Awesome sauce! Thank you!

## 2024-01-17 ENCOUNTER — Ambulatory Visit

## 2024-01-30 ENCOUNTER — Ambulatory Visit (INDEPENDENT_AMBULATORY_CARE_PROVIDER_SITE_OTHER): Admitting: Allergy & Immunology

## 2024-01-30 ENCOUNTER — Ambulatory Visit

## 2024-01-30 VITALS — BP 116/72 | HR 61 | Temp 97.8°F | Resp 14 | Ht 61.81 in | Wt 179.7 lb

## 2024-01-30 DIAGNOSIS — Z91013 Allergy to seafood: Secondary | ICD-10-CM | POA: Diagnosis not present

## 2024-01-30 DIAGNOSIS — R21 Rash and other nonspecific skin eruption: Secondary | ICD-10-CM | POA: Diagnosis not present

## 2024-01-30 MED ORDER — OMALIZUMAB 300 MG/2  ML ~~LOC~~ SOSY
450.0000 mg | PREFILLED_SYRINGE | SUBCUTANEOUS | Status: AC
  Administered 2024-01-30: 450 mg via SUBCUTANEOUS

## 2024-01-30 MED ORDER — FLUCONAZOLE 200 MG PO TABS: ORAL_TABLET | ORAL | 0 refills | Status: AC

## 2024-01-30 NOTE — Patient Instructions (Addendum)
 1. Shellfish allergy  - Our goal with the Xolair is PREVENT cross contamination of seafood with shrimp proteins, to allow you to safely eat other types of seafood.  - You definitely should continue to avoid shrimp.   2. Chronic rhinitis - Previous testing showed: grasses, ragweed, weeds, trees, indoor molds, outdoor molds, dust mites, cat, dog, and cockroach - Copy of test results provided.  - Continue taking: Xyzal  (levocetirizine) 5mg  tablet once daily - You can use an extra dose of the antihistamine, if needed, for breakthrough symptoms.  - Consider nasal saline rinses 1-2 times daily to remove allergens from the nasal cavities as well as help with mucous clearance (this is especially helpful to do before the nasal sprays are given) - Consider allergy  shots as a means of long-term control. - Allergy  shots "re-train" and "reset" the immune system to ignore environmental allergens and decrease the resulting immune response to those allergens (sneezing, itchy watery eyes, runny nose, nasal congestion, etc).    - Allergy  shots improve symptoms in 75-85% of patients.  - However, your symptoms do not seem severe enough to warrant allergy  shots at this point in time.   3. Rash - possibly tinea versicolor - This is caused by a fungus in the skin that attacks the melanocytes. - Continue Diflucan  200mg  weekly for 6 weeks.  - MyChart some pictures of your rash so they are in the chart for a review.  4. Return in about 6 months (around 08/01/2024).    Please inform us  of any Emergency Department visits, hospitalizations, or changes in symptoms. Call us  before going to the ED for breathing or allergy  symptoms since we might be able to fit you in for a sick visit. Feel free to contact us  anytime with any questions, problems, or concerns.  It was a pleasure to see you again today!  Websites that have reliable patient information: 1. American Academy of Asthma, Allergy , and Immunology:  www.aaaai.org 2. Food Allergy  Research and Education (FARE): foodallergy.org 3. Mothers of Asthmatics: http://www.asthmacommunitynetwork.org 4. [employer] of Allergy , Asthma, and Immunology: www.acaai.org      "Like" us  on Facebook and Instagram for our latest updates!      A healthy democracy works best when [employer] participate! Make sure you are registered to vote! If you have moved or changed any of your contact information, you will need to get this updated before voting! Scan the QR codes below to learn more!

## 2024-01-30 NOTE — Progress Notes (Deleted)
 Immunotherapy   Patient Details  Name: Angelica Moore MRN: [id number] Date of Birth: 1971/02/20  01/30/2024  Angelica Moore started injections for  food allergies. Patient received 450 mg Xolair and waited 1 hour with no problems.   Frequency:every 28 days Epi-Pen:Epi-Pen Available  Consent signed and patient instructions given.   Denton Flakes 01/30/2024, 2:59 PM

## 2024-01-30 NOTE — Progress Notes (Signed)
 Immunotherapy   Patient Details  Name: Angelica Moore MRN: [id number] Date of Birth: May 03, 1971  01/30/2024  Angelica Moore started injections for  Xolair Following schedule: Every twenty eight days. Frequency:Every four weeks.  Epi-Pen:Epi-Pen Available  Consent signed and patient instructions given. Patient waited in room thirteen fir sixty minutes without an issue.   Brutus Caprice 01/30/2024, 3:57 PM

## 2024-01-30 NOTE — Progress Notes (Signed)
 FOLLOW UP  Date of Service/Encounter:  01/30/24   Assessment:   Food allergy  (shrimp) - interested in Xolair to prevent cross contamination   Perennial and seasonal allergic rhinitis (grasses, ragweed, weeds, trees, indoor molds, outdoor molds, dust mites, cat, dog, and cockroach)   Tinea versicolor - stable (but not improving) on fluconazole  weekly and ketoconazole  shampoo  Plan/Recommendations:   1. Shellfish allergy  - Our goal with the Xolair is PREVENT cross contamination of seafood with shrimp proteins, to allow you to safely eat other types of seafood.  - You definitely should continue to avoid shrimp.   2. Chronic rhinitis - Previous testing showed: grasses, ragweed, weeds, trees, indoor molds, outdoor molds, dust mites, cat, dog, and cockroach - Copy of test results provided.  - Continue taking: Xyzal  (levocetirizine) 5mg  tablet once daily - You can use an extra dose of the antihistamine, if needed, for breakthrough symptoms.  - Consider nasal saline rinses 1-2 times daily to remove allergens from the nasal cavities as well as help with mucous clearance (this is especially helpful to do before the nasal sprays are given) - Consider allergy  shots as a means of long-term control. - Allergy  shots "re-train" and "reset" the immune system to ignore environmental allergens and decrease the resulting immune response to those allergens (sneezing, itchy watery eyes, runny nose, nasal congestion, etc).    - Allergy  shots improve symptoms in 75-85% of patients.  - However, your symptoms do not seem severe enough to warrant allergy  shots at this point in time.   3. Rash - possibly tinea versicolor - This is caused by a fungus in the skin that attacks the melanocytes. - Continue Diflucan  200mg  weekly for 6 weeks.  - MyChart some pictures of your rash so they are in the chart for a review.  4. Return in about 6 months (around 08/01/2024).   Subjective:   Angelica Moore is a 53  y.o. female presenting today for follow up of  Chief Complaint  Patient presents with   Angioedema    Started Xolair today    Angelica Moore has a history of the following: Patient Active Problem List   Diagnosis Date Noted   Cardiac murmur 01/01/2020   Eczema, dyshidrotic 05/27/2015   Vitamin D deficiency 04/22/2015   Heart palpitations 02/09/2015   Gastrointestinal food allergy  syndrome 10/29/2014   Insomnia 10/29/2014   B12 deficiency 10/28/2014   Family history of breast cancer in mother 04/06/2013   Encounter for screening colonoscopy 04/02/2013    History obtained from: chart review and patient.  Discussed the use of AI scribe software for clinical note transcription with the patient and/or guardian, who gave verbal consent to proceed.  Angelica Moore is a 53 y.o. female presenting for a follow up visit.  She was last seen in April 2025.  At that time, she made the decision to go ahead and start Xolair to prevent cross-contamination with her shrimp allergy .  She had testing in the past that was positive to multiple indoor and outdoor allergens.  Will continue with Xyzal  5 mg daily.  For her rash, we felt it might be related to tinea versicolor.  We started Diflucan  600 mg weekly for 6 weeks.  We also started ketoconazole  shampoo.  Since last visit, she has been OK.   Allergic Rhinitis Symptom History: She remains on her levocetirizine daily. Despite the multiple sensitizations, her symptoms are not that severe at all. She has not been on antibiotics for any sinus or  ear infections. She generally does pretty well from environmental allergens.   Food Allergy  Symptom History: She received her first Xolair injection today and is experiencing some nervousness about the treatment. There is confusion regarding shellfish consumption after starting Xolair, particularly concerning lobster and shrimp. She plans to avoid shellfish until she feels more confident, especially since she is going on a  cruise soon. But she has no interested in consuming shrimp at all. She mostly just wants to consume lobster or crab. She is wondering about whether she needs to continue to take the Xolair in order to eat the other shellfish. Again, she is not interested in eating shrimp at all.   Component     Latest Ref Rng 07/04/2023  Class Description Allergens Comment   F023-IgE Crab     Class 0/I kU/L 0.19 !   Shrimp IgE     Class IV kU/L 7.52 !   F080-IgE Lobster     Class 0/I kU/L 0.12 !   Clam IgE     Class 0 kU/L <0.10   F290-IgE Oyster     Class 0 kU/L <0.10   Scallop IgE     Class 0/I kU/L 0.30 !     Skin Symptom History: She has been experiencing skin issues and has been using a prescribed shampoo and medication for over a month with no significant improvement. Her skin condition has neither worsened nor improved, and new skin spots have appeared, which is concerning to her. She has seen dermatologists in the past, including Dr. Martina Sledge in [place], but has not found their treatments effective. She is currently on her second bottle of the prescribed treatment and has two fluconazole  pills left, which she takes once a week.  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 She is managing the condition with physical therapy and a knee brace and has not needed to use a cane. She is planning a trip to [place] in two weeks, which will include a cruise with her husband and friends, and is excited about the trip despite needing to manage her knee condition with a brace.   Otherwise, there have been no changes to her past medical history, surgical history, family history, or social history.    Review of systems otherwise negative other than that mentioned in the HPI.    Objective:   Blood pressure 116/72, pulse 61, temperature 97.8 F (36.6 C), temperature source Temporal, resp. rate 14, height 5' 1.81" (1.57 m), weight 179 lb 11.2 oz  (81.5 kg), SpO2 95%. Body mass index is 33.07 kg/m.    Physical Exam Vitals reviewed.  Constitutional:      Appearance: She is well-developed.  HENT:     Head: Normocephalic and atraumatic.     Right Ear: Tympanic membrane, ear canal and external ear normal. No drainage, swelling or tenderness. Tympanic membrane is not injected, scarred, erythematous, retracted or bulging.     Left Ear: Tympanic membrane, ear canal and external ear normal. No drainage, swelling or tenderness. Tympanic membrane is not injected, scarred, erythematous, retracted or bulging.     Nose: Mucosal edema and rhinorrhea present. No nasal deformity or septal deviation.     Right Turbinates: Enlarged, swollen and pale.     Left Turbinates: Enlarged, swollen and pale.     Right Sinus: No maxillary sinus tenderness or frontal sinus tenderness.     Left Sinus: No maxillary sinus tenderness or frontal  sinus tenderness.     Comments: No nasal polyps noted.     Mouth/Throat:     Lips: Pink.     Mouth: Mucous membranes are moist. Mucous membranes are not pale and not dry.     Pharynx: Uvula midline.     Comments: Cobblestoning present in the posterior oropharynx.  Eyes:     General: Lids are normal. Allergic shiner present.        Right eye: No discharge.        Left eye: No discharge.     Conjunctiva/sclera: Conjunctivae normal.     Right eye: Right conjunctiva is not injected. No chemosis.    Left eye: Left conjunctiva is not injected. No chemosis.    Pupils: Pupils are equal, round, and reactive to light.  Cardiovascular:     Rate and Rhythm: Normal rate and regular rhythm.     Heart sounds: Normal heart sounds.  Pulmonary:     Effort: Pulmonary effort is normal. No tachypnea, accessory muscle usage or respiratory distress.     Breath sounds: Normal breath sounds. No wheezing, rhonchi or rales.     Comments: No increased work of breathing noted.  Moving air well in all lung fields. No increased work of  breathing noted.  Chest:     Chest wall: No tenderness.  Abdominal:     Tenderness: There is no abdominal tenderness. There is no guarding or rebound.  Lymphadenopathy:     Head:     Right side of head: No submandibular, tonsillar or occipital adenopathy.     Left side of head: No submandibular, tonsillar or occipital adenopathy.     Cervical: No cervical adenopathy.  Skin:    General: Skin is warm.     Capillary Refill: Capillary refill takes less than 2 seconds.     Coloration: Skin is not pale.     Findings: No abrasion, erythema, petechiae or rash. Rash is not papular, urticarial or vesicular.     Comments: She has several hypopigmented lesions over her entire body.  They are not confluent like vitiligo, but appear more like tinea versicolor.  It is hard to tell whether they are getting better, but she does note that they have not been spreading, so I guess that is something.   Neurological:     Mental Status: She is alert.  Psychiatric:        Behavior: Behavior is cooperative.      Diagnostic studies: none      Drexel Gentles, MD  [facility] 

## 2024-01-31 ENCOUNTER — Encounter: Payer: Self-pay | Admitting: Allergy & Immunology

## 2024-01-31 ENCOUNTER — Ambulatory Visit

## 2024-02-20 ENCOUNTER — Telehealth: Payer: Self-pay | Admitting: Allergy & Immunology

## 2024-02-20 NOTE — Telephone Encounter (Signed)
 Morgane has been internally referred to [facility].  They reached out to her on 6/6 at 9:49 am and left a voicemail to schedule.

## 2024-02-27 ENCOUNTER — Ambulatory Visit

## 2024-02-28 ENCOUNTER — Ambulatory Visit

## 2024-02-28 NOTE — Telephone Encounter (Signed)
 Angelica Moore has been scheduled with  dermatology for 09/16/24 at 2:00 pm with Dr. Beecher Bower.

## 2024-03-09 ENCOUNTER — Ambulatory Visit

## 2024-04-13 ENCOUNTER — Telehealth: Payer: Self-pay

## 2024-04-13 NOTE — Telephone Encounter (Signed)
 Patient called in regarding Xolair  appointments. She has received one injection but cancelled/no showed future appointments. LVM to schedule an appointment at her earliest convenience

## 2024-06-18 ENCOUNTER — Telehealth: Payer: Self-pay | Admitting: Allergy & Immunology

## 2024-06-18 NOTE — Telephone Encounter (Signed)
 Left voicemail to give the office a call back to schedule Xolair  reapproval appointment. Patient is scheduled for November but does need to be seen sooner.

## 2024-07-30 ENCOUNTER — Ambulatory Visit: Admitting: Allergy & Immunology

## 2024-09-16 ENCOUNTER — Ambulatory Visit: Admitting: Physician Assistant

## 2025-05-24 ENCOUNTER — Ambulatory Visit: Admitting: Physician Assistant
